# Patient Record
Sex: Male | Born: 1990 | Race: Black or African American | Hispanic: No | Marital: Single | Smoking: Never smoker
Health system: Southern US, Community
[De-identification: ages and names within clinical notes are randomized; demographics above are authoritative.]

## PROBLEM LIST (undated history)

## (undated) DIAGNOSIS — J45909 Unspecified asthma, uncomplicated: Secondary | ICD-10-CM

## (undated) DIAGNOSIS — R011 Cardiac murmur, unspecified: Secondary | ICD-10-CM

---
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
## 2012-07-22 ENCOUNTER — Emergency Department (HOSPITAL_COMMUNITY)
Admission: EM | Admit: 2012-07-22 | Discharge: 2012-07-22 | Disposition: A | Discharge status: Home / Self Care | Payer: Self-pay | Attending: Emergency Medicine | Admitting: Emergency Medicine

## 2012-07-22 ENCOUNTER — Encounter (HOSPITAL_COMMUNITY): Payer: Self-pay | Admitting: *Deleted

## 2012-07-22 DIAGNOSIS — L0291 Cutaneous abscess, unspecified: Secondary | ICD-10-CM

## 2012-07-22 DIAGNOSIS — IMO0002 Reserved for concepts with insufficient information to code with codable children: Secondary | ICD-10-CM | POA: Insufficient documentation

## 2012-07-22 MED ORDER — OXYCODONE-ACETAMINOPHEN 5-325 MG PO TABS: 2.0000 | ORAL_TABLET | ORAL | Status: AC | PRN

## 2012-07-22 MED ORDER — SULFAMETHOXAZOLE-TRIMETHOPRIM 800-160 MG PO TABS: 1.0000 | ORAL_TABLET | Freq: Two times a day (BID) | ORAL | Status: AC

## 2012-07-22 MED ORDER — LIDOCAINE HCL 2 % IJ SOLN
10.0000 mL | Freq: Once | INTRAMUSCULAR | Status: AC
  Administered 2012-07-22: 200 mg via INTRADERMAL

## 2012-07-22 NOTE — ED Notes (Signed)
Paged ortho 

## 2012-07-22 NOTE — ED Notes (Signed)
Suture cart and I&D supplies placed at room

## 2012-07-22 NOTE — Progress Notes (Signed)
Orthopedic Tech Progress Note Patient Details:  Carl [name] 07/06/1991 [id number]  Ortho Devices Type of Ortho Device: Arm foam sling Ortho Device/Splint Location: left arm Ortho Device/Splint Interventions: Application   Erik Burkett 07/22/2012, 4:04 PM

## 2012-07-22 NOTE — ED Notes (Signed)
Pt reports progressively worsening abscesses to left axillary. Denies fevers. No drainage.

## 2012-07-22 NOTE — ED Provider Notes (Signed)
History  This chart was scribed for No att. providers found by [facility]. This patient was seen in room TR05C/TR05C and the patient's care was started at 1256.   CSN: [id number]  Arrival date & time 07/22/12  1256   None     Chief Complaint  Patient presents with  . Abscess   Patient is a 21 y.o. male presenting with abscess. The history is provided by the patient. No language interpreter was used.  Abscess  Affected Location: left axilla. Pertinent negatives include no fever and no vomiting.   Carl [name] is a 21 y.o. male who presents to the Emergency Department complaining of constant gradually worsening abscesses to his left axillary. He denies any drainage to the area.   History reviewed. No pertinent past medical history.  History reviewed. No pertinent past surgical history.  History reviewed. No pertinent family history.  History  Substance Use Topics  . Smoking status: Never Smoker   . Smokeless tobacco: Not on file  . Alcohol Use: No      Review of Systems  Constitutional: Negative for fever and chills.  Respiratory: Negative for shortness of breath.   Gastrointestinal: Negative for nausea and vomiting.  Skin:       Left axillary abscesses.   Neurological: Negative for weakness.  All other systems reviewed and are negative.    Allergies  Review of patient's allergies indicates no known allergies.  Home Medications  No current outpatient prescriptions on file.  Triage Vitals: BP 146/67  Pulse 81  Temp 98.7 F (37.1 C)  Resp 16  SpO2 100%  Physical Exam  Nursing note and vitals reviewed. Constitutional: He is oriented to person, place, and time. He appears well-developed and well-nourished. No distress.  HENT:  Head: Normocephalic and atraumatic.  Eyes: EOM are normal.  Neck: Neck supple. No tracheal deviation present.  Cardiovascular: Normal rate.   Pulmonary/Chest: Effort normal. No respiratory distress.  Musculoskeletal: Normal  range of motion.  Neurological: He is alert and oriented to person, place, and time.  Skin: Skin is warm and dry.  Psychiatric: He has a normal mood and affect. His behavior is normal.    ED Course  Procedures (including critical care time) DIAGNOSTIC STUDIES: Oxygen Saturation is 100% on room air, normal by my interpretation.    COORDINATION OF CARE:   Labs Reviewed - No data to display No results found.   No diagnosis found.    MDM    I personally performed the services described in this documentation, which was scribed in my presence. The recorded information has been reviewed and considered.       Hilario Quarry, MD 07/23/12 [phone]

## 2012-07-24 ENCOUNTER — Encounter (HOSPITAL_COMMUNITY): Payer: Self-pay | Admitting: *Deleted

## 2012-07-24 ENCOUNTER — Emergency Department (HOSPITAL_COMMUNITY)
Admission: EM | Admit: 2012-07-24 | Discharge: 2012-07-24 | Disposition: A | Discharge status: Home / Self Care | Payer: Self-pay | Attending: Emergency Medicine | Admitting: Emergency Medicine

## 2012-07-24 DIAGNOSIS — IMO0002 Reserved for concepts with insufficient information to code with codable children: Secondary | ICD-10-CM | POA: Insufficient documentation

## 2012-07-24 DIAGNOSIS — L02419 Cutaneous abscess of limb, unspecified: Secondary | ICD-10-CM

## 2012-07-24 DIAGNOSIS — Z4801 Encounter for change or removal of surgical wound dressing: Secondary | ICD-10-CM | POA: Insufficient documentation

## 2012-07-24 DIAGNOSIS — Z5189 Encounter for other specified aftercare: Secondary | ICD-10-CM

## 2012-07-24 MED ORDER — TRAMADOL HCL 50 MG PO TABS: 50.0000 mg | ORAL_TABLET | Freq: Four times a day (QID) | ORAL | Status: AC | PRN

## 2012-07-24 NOTE — ED Notes (Signed)
Pt reports he needs to have packing removed from (L) axilla. Packing and dressing applied on Tuesday, pt reports he was instructed to come back on Thursday to have them removed.

## 2012-07-24 NOTE — ED Notes (Signed)
Patient is here to have packing removed from the left axilla,  The dressing/packing was applied 2 days ago

## 2012-07-24 NOTE — ED Provider Notes (Addendum)
History  This chart was scribed for Carl Skene, MD by Bennett Scrape. This patient was seen in room TR09C/TR09C and the patient's care was started at 3:30PM.  CSN: [id number]  Arrival date & time 07/24/12  1302   None     Chief Complaint  Patient presents with  . Wound Check     The history is provided by the patient. No language interpreter was used.    Carl [name] is a 21 y.o. male who presents to the Emergency Department for removal of packing from the left axilla that was applied to two separate I&D boils 2 days ago. He reports that one of the packing fell out when he removed the band-aid in the ED. He rates his pain a 13 out of 10 when he moves his arm. He states that he is currently taking Septra and was taking Percocet but ran out. He denies fevers, chills, CP, SOB and myalgias as associated symptoms. He does not have a h/o chronic medical conditions. He denies smoking and alcohol use.  History reviewed. No pertinent past medical history.  History reviewed. No pertinent past surgical history.  No family history on file.  History  Substance Use Topics  . Smoking status: Never Smoker   . Smokeless tobacco: Not on file  . Alcohol Use: No      Review of Systems  REVIEW OF SYSTEMS:   1.) CONSTITUTIONAL: No fever, chills or systemic signs of infection. No recent, unexplained weight changes.   2.) HEENT: No facial pain, sinus congestion or rhinorrhea is reported. Patient is denying any acute visual or hearing deficits. No sore throat or difficulty swallowing.  3.) NECK: No swelling or masses are reported.   4.) PULMONARY: No cough sputum production or shortness of breath was reported.   5.) CARDIAC: No palpitations, chest pain or pressure.   6.) ABDOMINAL: Denies abdominal pain, nausea, vomiting or diarrhea. No Hematochezia or melena.  7.) GENITOURINARY: No burning with urination or frequency. No discharge.  8.) BACK: Denying any flank or CVA  tenderness. No specific thoracic or lumbar pain.   9.) EXTREMITIES: Denying any extremity edema pitting or rash.   10.) NEUROLOGIC: Denying any focal or lateralizing neurologic impairments.     11.) SKIN: Two I&D areas on the left axilla. No rashes, itching   Allergies  Review of patient's allergies indicates no known allergies.  Home Medications   Current Outpatient Rx  Name Route Sig Dispense Refill  . OXYCODONE-ACETAMINOPHEN 5-325 MG PO TABS Oral Take 2 tablets by mouth every 4 (four) hours as needed for pain. 10 tablet 0  . SULFAMETHOXAZOLE-TRIMETHOPRIM 800-160 MG PO TABS Oral Take 1 tablet by mouth every 12 (twelve) hours. 10 tablet 0    Triage Vitals: BP 121/70  Pulse 63  Temp 98.3 F (36.8 C) (Oral)  Resp 16  SpO2 99%  Physical Exam   Nursing notes reviewed.  Electronic medical record reviewed. VITAL SIGNS:   Filed Vitals:   07/24/12 1312  BP: 121/70  Pulse: 63  Temp: 98.3 F (36.8 C)  TempSrc: Oral  Resp: 16  SpO2: 99%   CONSTITUTIONAL: Awake, oriented, appears non-toxic HENT: Atraumatic, normocephalic, oral mucosa pink and moist, airway patent. Nares patent without drainage. External ears normal. EYES: Conjunctiva clear, EOMI, PERRLA NECK: Trachea midline, non-tender, supple CARDIOVASCULAR: Normal heart rate, Normal rhythm, No murmurs, rubs, gallops PULMONARY/CHEST: Clear to auscultation, no rhonchi, wheezes, or rales. Symmetrical breath sounds. Non-tender. ABDOMINAL: Non-distended, soft, non-tender - no rebound or guarding.  BS normal. NEUROLOGIC: Non-focal, moving all four extremities, no gross sensory or motor deficits. EXTREMITIES: No clubbing, cyanosis, or edema.  2 abscess sites in left axilla.  One had packing fall out and is healing well.  The other still has a fair amount of induration - packing removed with some pus on packing material. SKIN: Warm, Dry, No erythema, No rash  ED Course  Wound re-exploration Performed by: Carl [name] Authorized by: Carl [name] Consent: Verbal consent obtained. Consent given by: patient Patient identity confirmed: verbally with patient Type: abscess Body area: upper extremity Local anesthetic: lidocaine 2% with epinephrine Anesthetic total: 2 ml Patient sedated: no Incision type: Used prior incision, explored with mosquito clamp. Drainage: serosanguinous Drainage amount: scant Patient tolerance: Patient tolerated the procedure well with no immediate complications.   (including critical care time)  DIAGNOSTIC STUDIES: Oxygen Saturation is 99% on room air, normal by my interpretation.    COORDINATION OF CARE: 3:55PM-Discussed treatment plan which includes cleaning out the I&D areas with pt at bedside and pt agreed to plan.  3:57PM-Flushed the area with 2 cc of 2% xylocaine with epi and explored the area.  4:00PM-Discussed discharge plan with pt at bedside and pt agreed to plan.   Labs Reviewed - No data to display No results found.   No diagnosis found.    MDM  Carl [name] is a 21 y.o. male presenting for a wound recheck of axillary skin abscess.  Packing removed from larger of two abscesses - flushed with lidocaine with epinephrine and explored for loculations as abscess site seemed painful still not consistent with healing.  No further material was expressed.  Area covered with bandages.  Packing not indicated.  Antibiotics not indicated, no surrounding cellulitis.  I explained the diagnosis and have given explicit precautions to return to the ER including worsening pain, draining pus, fever, vomiting or any other new or worsening symptoms. The patient understands and accepts the medical plan as it's been dictated and I have answered their questions. Discharge instructions concerning home care and prescriptions have been given.  The patient is STABLE and is discharged to home in good condition.    I personally performed the services described in this  documentation, which was scribed in my presence. The recorded information has been reviewed and considered. Carl [name], M.D.      Carl Skene, MD 07/28/12 1334  Carl Skene, MD 07/28/12 1336

## 2014-01-15 ENCOUNTER — Encounter (HOSPITAL_COMMUNITY): Payer: Self-pay | Admitting: Emergency Medicine

## 2014-01-15 ENCOUNTER — Emergency Department (INDEPENDENT_AMBULATORY_CARE_PROVIDER_SITE_OTHER)
Admission: EM | Admit: 2014-01-15 | Discharge: 2014-01-15 | Disposition: A | Discharge status: Home / Self Care | Payer: BC Managed Care – PPO | Source: Home / Self Care

## 2014-01-15 DIAGNOSIS — S43409A Unspecified sprain of unspecified shoulder joint, initial encounter: Secondary | ICD-10-CM

## 2014-01-15 DIAGNOSIS — IMO0002 Reserved for concepts with insufficient information to code with codable children: Secondary | ICD-10-CM

## 2014-01-15 DIAGNOSIS — M25519 Pain in unspecified shoulder: Secondary | ICD-10-CM

## 2014-01-15 DIAGNOSIS — M25512 Pain in left shoulder: Secondary | ICD-10-CM

## 2014-01-15 MED ORDER — NAPROXEN 500 MG PO TABS: 500.0000 mg | ORAL_TABLET | Freq: Two times a day (BID) | ORAL | Status: DC

## 2014-01-15 NOTE — ED Notes (Signed)
Patient complains of left shoulder pain May have injured it playing basketball

## 2014-01-15 NOTE — ED Provider Notes (Signed)
Medical screening examination/treatment/procedure(s) were performed by non-physician practitioner and as supervising physician I was immediately available for consultation/collaboration.  Leslee Homeavid Tinie Mcgloin, M.D.  Reuben Likesavid C Sarahelizabeth Conway, MD 01/15/14 2224

## 2014-01-15 NOTE — ED Provider Notes (Signed)
CSN: [id number]     Arrival date & time 01/15/14  1745 History   None    Chief Complaint  Patient presents with  . Shoulder Pain   (Consider location/radiation/quality/duration/timing/severity/associated sxs/prior Treatment) HPI Comments: Patient presents with a 2 day history of left shoulder pain. 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 He felt a "twinge" while lifting 65lb box at [employer] a week ago, but it felt fine until a few days ago. Pain is anterior and with ROM. Denies neck pain. Denies numbness or tingling. Denies weakness. No previous injury to left shoulder.  Patient is a 23 y.o. male presenting with shoulder pain. The history is provided by the patient.  Shoulder Pain    History reviewed. No pertinent past medical history. History reviewed. No pertinent past surgical history. No family history on file. History  Substance Use Topics  . Smoking status: Never Smoker   . Smokeless tobacco: Not on file  . Alcohol Use: No    Review of Systems  All other systems reviewed and are negative.    Allergies  Review of patient's allergies indicates no known allergies.  Home Medications   Current Outpatient Rx  Name  Route  Sig  Dispense  Refill  . naproxen (NAPROSYN) 500 MG tablet   Oral   Take 1 tablet (500 mg total) by mouth 2 (two) times daily.   30 tablet   0    BP 118/76  Pulse 68  Temp(Src) 97.7 F (36.5 C) (Oral)  Resp 16  SpO2 99% Physical Exam  Nursing note and vitals reviewed. Constitutional: He is oriented to person, place, and time. He appears well-developed and well-nourished. No distress.  HENT:  Head: Normocephalic and atraumatic.  Neck: Normal range of motion.  Pulmonary/Chest: Effort normal.  Musculoskeletal: He exhibits no edema and no tenderness.  Left shoulder without swelling. No effusions or deformities. Full painful ROM in all planes, but limited internal rotation. Good strength. Negative impingement sign   Neurological: He is alert and oriented to person, place, and time. A cranial nerve deficit is present. He exhibits normal muscle tone.  Skin: Skin is warm and dry. He is not diaphoretic.  Psychiatric: His behavior is normal.    ED Course  Procedures (including critical care time) Labs Review Labs Reviewed - No data to display Imaging Review No results found.   MDM   1. Shoulder sprain   2. Shoulder pain, left    Probable tendonitis/strain. Good strength; doubtful tear. Instruct to ice, rest, gentle ROM exercises without heavy lifting, and NSAID's.  He requested sling, and this is ok for comfort some of the time, but to avoid adhesive capsulitis rec coming out multiple times a day for gentle ROM. May f/u with Dr. Lajoyce Cornersuda if not improved.     Riki SheerMichelle G Aquinnah Devin, PA-C 01/15/14 1929

## 2014-01-15 NOTE — Discharge Instructions (Signed)
Muscle Strain A muscle strain (pulled muscle) happens when a muscle is stretched beyond normal length. It happens when a sudden, violent force stretches your muscle too far. Usually, a few of the fibers in your muscle are torn. Muscle strain is common in athletes. Recovery usually takes 1 2 weeks. Complete healing takes 5 6 weeks.  HOME CARE   Follow the PRICE method of treatment to help your injury get better. Do this the first 2 3 days after the injury:  Protect. Protect the muscle to keep it from getting injured again.  Rest. Limit your activity and rest the injured body part.  Ice. Put ice in a plastic bag. Place a towel between your skin and the bag. Then, apply the ice and leave it on from 15 20 minutes each hour. After the third day, switch to moist heat packs.  Compression. Use a splint or elastic bandage on the injured area for comfort. Do not put it on too tightly.  Elevate. Keep the injured body part above the level of your heart.  Only take medicine as told by your doctor.  Warm up before doing exercise to prevent future muscle strains. GET HELP IF:   You have more pain or puffiness (swelling) in the injured area.  You feel numbness, tingling, or notice a loss of strength in the injured area. MAKE SURE YOU:   Understand these instructions.  Will watch your condition.  Will get help right away if you are not doing well or get worse. Document Released: 08/07/2008 Document Revised: 08/19/2013 Document Reviewed: 05/28/2013 [facility] Patient Information 2014 [place], [place].   Ok to use sling for comfort, but come out of it for ROM. Apply ice to area 4x day for 20 min. Take the Naprosyn 2x a day for the next week. F/U with Dr. Lajoyce Cornersuda

## 2014-07-23 ENCOUNTER — Emergency Department (HOSPITAL_COMMUNITY)
Admission: EM | Admit: 2014-07-23 | Discharge: 2014-07-23 | Disposition: A | Discharge status: Home / Self Care | Payer: BC Managed Care – PPO | Attending: Emergency Medicine | Admitting: Emergency Medicine

## 2014-07-23 ENCOUNTER — Encounter (HOSPITAL_COMMUNITY): Payer: Self-pay | Admitting: Emergency Medicine

## 2014-07-23 DIAGNOSIS — R011 Cardiac murmur, unspecified: Secondary | ICD-10-CM | POA: Diagnosis not present

## 2014-07-23 DIAGNOSIS — Y92009 Unspecified place in unspecified non-institutional (private) residence as the place of occurrence of the external cause: Secondary | ICD-10-CM | POA: Insufficient documentation

## 2014-07-23 DIAGNOSIS — T5891XA Toxic effect of carbon monoxide from unspecified source, accidental (unintentional), initial encounter: Secondary | ICD-10-CM | POA: Diagnosis not present

## 2014-07-23 DIAGNOSIS — Z7729 Contact with and (suspected ) exposure to other hazardous substances: Secondary | ICD-10-CM

## 2014-07-23 DIAGNOSIS — Z791 Long term (current) use of non-steroidal anti-inflammatories (NSAID): Secondary | ICD-10-CM | POA: Insufficient documentation

## 2014-07-23 DIAGNOSIS — T5894XA Toxic effect of carbon monoxide from unspecified source, undetermined, initial encounter: Secondary | ICD-10-CM | POA: Insufficient documentation

## 2014-07-23 DIAGNOSIS — J45909 Unspecified asthma, uncomplicated: Secondary | ICD-10-CM | POA: Insufficient documentation

## 2014-07-23 DIAGNOSIS — Y9389 Activity, other specified: Secondary | ICD-10-CM | POA: Insufficient documentation

## 2014-07-23 HISTORY — DX: Cardiac murmur, unspecified: R01.1

## 2014-07-23 HISTORY — DX: Unspecified asthma, uncomplicated: J45.909

## 2014-07-23 NOTE — Discharge Instructions (Signed)
You have been medically cleared by the emergency department.  °Signs and symptoms of carbon monoxide poisoning may include:  °Dull headache  °Weakness  °Dizziness  °Nausea or vomiting  °Shortness of breath  °Confusion  °Blurred vision  °Loss of consciousness  °Carbon monoxide poisoning can be especially dangerous for people who are sleeping or intoxicated. People may have irreversible brain damage or even be killed before anyone realizes there's a problem.  °Please return if these symptoms worsen.  ° °
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
## 2014-07-23 NOTE — ED Provider Notes (Signed)
Medical screening examination/treatment/procedure(s) were performed by non-physician practitioner and as supervising physician I was immediately available for consultation/collaboration.   EKG Interpretation None        Zanovia Rotz, MD 07/23/14 1605 

## 2014-10-04 ENCOUNTER — Emergency Department (HOSPITAL_COMMUNITY)
Admission: EM | Admit: 2014-10-04 | Discharge: 2014-10-04 | Disposition: A | Discharge status: Home / Self Care | Payer: BC Managed Care – PPO | Attending: Emergency Medicine | Admitting: Emergency Medicine

## 2014-10-04 ENCOUNTER — Encounter (HOSPITAL_COMMUNITY): Payer: Self-pay | Admitting: *Deleted

## 2014-10-04 DIAGNOSIS — S29012A Strain of muscle and tendon of back wall of thorax, initial encounter: Secondary | ICD-10-CM | POA: Diagnosis not present

## 2014-10-04 DIAGNOSIS — S3992XA Unspecified injury of lower back, initial encounter: Secondary | ICD-10-CM | POA: Diagnosis present

## 2014-10-04 DIAGNOSIS — Y92410 Unspecified street and highway as the place of occurrence of the external cause: Secondary | ICD-10-CM | POA: Diagnosis not present

## 2014-10-04 DIAGNOSIS — Y9389 Activity, other specified: Secondary | ICD-10-CM | POA: Insufficient documentation

## 2014-10-04 DIAGNOSIS — R011 Cardiac murmur, unspecified: Secondary | ICD-10-CM | POA: Insufficient documentation

## 2014-10-04 DIAGNOSIS — Z791 Long term (current) use of non-steroidal anti-inflammatories (NSAID): Secondary | ICD-10-CM | POA: Insufficient documentation

## 2014-10-04 DIAGNOSIS — Y998 Other external cause status: Secondary | ICD-10-CM | POA: Diagnosis not present

## 2014-10-04 DIAGNOSIS — J45909 Unspecified asthma, uncomplicated: Secondary | ICD-10-CM | POA: Insufficient documentation

## 2014-10-04 DIAGNOSIS — Z79899 Other long term (current) drug therapy: Secondary | ICD-10-CM | POA: Diagnosis not present

## 2014-10-04 DIAGNOSIS — S39012A Strain of muscle, fascia and tendon of lower back, initial encounter: Secondary | ICD-10-CM | POA: Diagnosis not present

## 2014-10-04 DIAGNOSIS — M549 Dorsalgia, unspecified: Secondary | ICD-10-CM

## 2014-10-04 DIAGNOSIS — T148XXA Other injury of unspecified body region, initial encounter: Secondary | ICD-10-CM

## 2014-10-04 MED ORDER — METHOCARBAMOL 500 MG PO TABS: 500.0000 mg | ORAL_TABLET | Freq: Two times a day (BID) | ORAL | Status: DC

## 2014-10-04 MED ORDER — HYDROCODONE-ACETAMINOPHEN 5-325 MG PO TABS
2.0000 | ORAL_TABLET | Freq: Once | ORAL | Status: AC
  Administered 2014-10-04: 2 via ORAL
  Filled 2014-10-04: qty 2

## 2014-10-04 MED ORDER — METHOCARBAMOL 500 MG PO TABS
500.0000 mg | ORAL_TABLET | Freq: Once | ORAL | Status: AC
  Administered 2014-10-04: 500 mg via ORAL
  Filled 2014-10-04: qty 1

## 2014-10-04 MED ORDER — PREDNISONE 20 MG PO TABS: 40.0000 mg | ORAL_TABLET | Freq: Every day | ORAL | Status: DC

## 2014-10-04 MED ORDER — TRAMADOL HCL 50 MG PO TABS
50.0000 mg | ORAL_TABLET | Freq: Four times a day (QID) | ORAL | Status: DC | PRN
Start: 2014-10-04 — End: 2014-12-24
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
## 2014-12-24 ENCOUNTER — Encounter (HOSPITAL_COMMUNITY): Payer: Self-pay | Admitting: Emergency Medicine

## 2014-12-24 ENCOUNTER — Emergency Department (HOSPITAL_COMMUNITY)
Admission: EM | Admit: 2014-12-24 | Discharge: 2014-12-24 | Disposition: A | Discharge status: Home / Self Care | Payer: BLUE CROSS/BLUE SHIELD | Attending: Emergency Medicine | Admitting: Emergency Medicine

## 2014-12-24 ENCOUNTER — Emergency Department (HOSPITAL_COMMUNITY): Payer: BLUE CROSS/BLUE SHIELD

## 2014-12-24 ENCOUNTER — Emergency Department (HOSPITAL_COMMUNITY)
Admission: EM | Admit: 2014-12-24 | Discharge: 2014-12-24 | Disposition: A | Discharge status: Nursing Home (Includes ICF) | Payer: PRIVATE HEALTH INSURANCE | Source: Home / Self Care | Attending: Emergency Medicine | Admitting: Emergency Medicine

## 2014-12-24 DIAGNOSIS — R0789 Other chest pain: Secondary | ICD-10-CM

## 2014-12-24 DIAGNOSIS — J45909 Unspecified asthma, uncomplicated: Secondary | ICD-10-CM | POA: Insufficient documentation

## 2014-12-24 DIAGNOSIS — R079 Chest pain, unspecified: Secondary | ICD-10-CM

## 2014-12-24 DIAGNOSIS — Z79899 Other long term (current) drug therapy: Secondary | ICD-10-CM | POA: Insufficient documentation

## 2014-12-24 DIAGNOSIS — R011 Cardiac murmur, unspecified: Secondary | ICD-10-CM | POA: Insufficient documentation

## 2014-12-24 LAB — CBC WITH DIFFERENTIAL/PLATELET
BASOS ABS: 0 10*3/uL (ref 0.0–0.1)
BASOS PCT: 0 % (ref 0–1)
Eosinophils Absolute: 0.2 10*3/uL (ref 0.0–0.7)
Eosinophils Relative: 3 % (ref 0–5)
HCT: 36.3 % — ABNORMAL LOW (ref 39.0–52.0)
Hemoglobin: 12.3 g/dL — ABNORMAL LOW (ref 13.0–17.0)
LYMPHS PCT: 39 % (ref 12–46)
Lymphs Abs: 1.7 10*3/uL (ref 0.7–4.0)
MCH: 28.4 pg (ref 26.0–34.0)
MCHC: 33.9 g/dL (ref 30.0–36.0)
MCV: 83.8 fL (ref 78.0–100.0)
Monocytes Absolute: 0.3 10*3/uL (ref 0.1–1.0)
Monocytes Relative: 7 % (ref 3–12)
NEUTROS ABS: 2.3 10*3/uL (ref 1.7–7.7)
NEUTROS PCT: 51 % (ref 43–77)
Platelets: 198 10*3/uL (ref 150–400)
RBC: 4.33 MIL/uL (ref 4.22–5.81)
RDW: 12.7 % (ref 11.5–15.5)
WBC: 4.5 10*3/uL (ref 4.0–10.5)

## 2014-12-24 LAB — I-STAT TROPONIN, ED: TROPONIN I, POC: 0 ng/mL (ref 0.00–0.08)

## 2014-12-24 LAB — BASIC METABOLIC PANEL
Anion gap: 4 — ABNORMAL LOW (ref 5–15)
BUN: 8 mg/dL (ref 6–23)
CALCIUM: 8.9 mg/dL (ref 8.4–10.5)
CO2: 32 mmol/L (ref 19–32)
Chloride: 103 mmol/L (ref 96–112)
Creatinine, Ser: 0.88 mg/dL (ref 0.50–1.35)
GLUCOSE: 89 mg/dL (ref 70–99)
POTASSIUM: 3.6 mmol/L (ref 3.5–5.1)
Sodium: 139 mmol/L (ref 135–145)

## 2014-12-24 MED ORDER — OMEPRAZOLE 20 MG PO CPDR: 20.0000 mg | DELAYED_RELEASE_CAPSULE | Freq: Every day | ORAL | Status: DC

## 2014-12-24 MED ORDER — ALBUTEROL SULFATE HFA 108 (90 BASE) MCG/ACT IN AERS
1.0000 | INHALATION_SPRAY | Freq: Four times a day (QID) | RESPIRATORY_TRACT | Status: DC | PRN
Start: 2014-12-24 — End: 2019-11-25

## 2014-12-24 NOTE — ED Notes (Signed)
Phlebotomy at the bedside  

## 2014-12-24 NOTE — ED Provider Notes (Signed)
CSN: [id number]     Arrival date & time 12/24/14  2104 History   First MD Initiated Contact with Patient 12/24/14 2113     Chief Complaint  Patient presents with  . Chest Pain     Patient is a 24 y.o. male presenting with chest pain. The history is provided by the patient. No language interpreter was used.  Chest Pain  Carl [name]  Reasons for evaluation of chest pain. He reports that he has had intermittent right-sided chest pain since December. The pain is described as sharp at times. It is located along his epigastrium to central sternum and right chest. Symptoms are waxing and waning with no clear alleviating or worsening factors. Denies any fevers, coughing, shortness of breath. He occasionally has shortness of breath with the chest pain. He denies any leg swelling or pain, abdominal pain, vomiting, diarrhea. When he has his episodes he takes Tums and this inhaler and sometimes he feels better. He was referred from urgent care for evaluation after having an abnormal EKG performed there.  Past Medical History  Diagnosis Date  . Heart murmur   . Asthma    History reviewed. No pertinent past surgical history. History reviewed. No pertinent family history. History  Substance Use Topics  . Smoking status: Never Smoker   . Smokeless tobacco: Not on file  . Alcohol Use: Yes     Comment: social    Review of Systems  Cardiovascular: Positive for chest pain.  All other systems reviewed and are negative.     Allergies  Nsaids  Home Medications   Prior to Admission medications   Medication Sig Start Date End Date Taking? Authorizing Provider  albuterol (PROVENTIL HFA;VENTOLIN HFA) 108 (90 BASE) MCG/ACT inhaler Inhale 2 puffs into the lungs every 6 (six) hours as needed for wheezing or shortness of breath.   Yes Historical Provider, MD  omeprazole (PRILOSEC) 20 MG capsule Take 1 capsule (20 mg total) by mouth daily. 12/24/14   Tilden FossaElizabeth Jonne Rote, MD   BP 118/61 mmHg  Pulse 66   Temp(Src) 98 F (36.7 C) (Oral)  Resp 14  Ht 6' (1.829 m)  Wt 210 lb (95.255 kg)  BMI 28.47 kg/m2  SpO2 98% Physical Exam  Constitutional: He is oriented to person, place, and time. He appears well-developed and well-nourished.  HENT:  Head: Normocephalic and atraumatic.  Cardiovascular: Normal rate and regular rhythm.   No murmur heard. Pulmonary/Chest: Effort normal and breath sounds normal. No respiratory distress.  Abdominal: Soft. There is no tenderness. There is no rebound and no guarding.  Musculoskeletal: He exhibits no edema or tenderness.  Neurological: He is alert and oriented to person, place, and time.  Skin: Skin is warm and dry.  Psychiatric: He has a normal mood and affect. His behavior is normal.  Nursing note and vitals reviewed.   ED Course  Procedures (including critical care time) Labs Review Labs Reviewed  CBC WITH DIFFERENTIAL/PLATELET - Abnormal; Notable for the following:    Hemoglobin 12.3 (*)    HCT 36.3 (*)    All other components within normal limits  BASIC METABOLIC PANEL - Abnormal; Notable for the following:    Anion gap 4 (*)    All other components within normal limits  I-STAT TROPOININ, ED    Imaging Review Dg Chest 2 View  12/24/2014   CLINICAL DATA:  Midsternal chest pain. Shortness of breath. Abnormal EKG.  EXAM: CHEST  2 VIEW  COMPARISON:  None.  FINDINGS: The heart size  and mediastinal contours are within normal limits. Both lungs are clear. The visualized skeletal structures are unremarkable.  IMPRESSION: No active cardiopulmonary disease.   Electronically Signed   By: Myles Rosenthal M.D.   On: 12/24/2014 21:48     EKG Interpretation   Date/Time:  Friday December 24 2014 21:11:03 EST Ventricular Rate:  61 PR Interval:  188 QRS Duration: 82 QT Interval:  403 QTC Calculation: 406 R Axis:   86 Text Interpretation:  Sinus rhythm Probable anteroseptal infarct, old ST  elevation - c/w early repolarization Confirmed by Lincoln Brigham  [phone]) on  12/24/2014 9:50:17 PM      MDM   Final diagnoses:  Atypical chest pain     Patient with history of heart murmur here for evaluation of chest pain from urgent care given his abnormal EKG there. Reviewed EKG from urgent care - there was some artifact present and some ST elevation consistent with early repolarization. Clinical picture is not consistent with STEMI, pericarditis, dissection. Patient is perk negative, doubt PE. Chest pain appears to be more GI related. Starting on omeprazole. Do recommend the patient follows up with cardiology 4 further evaluation given that he has been followed by cardiology in the past for heart murmur with echo is performed. He denies a history of HOCM.    Tilden Fossa, MD 12/25/14 [phone]

## 2014-12-24 NOTE — Discharge Instructions (Signed)
Chest Pain (Nonspecific) °It is often hard to give a specific diagnosis for the cause of chest pain. There is always a chance that your pain could be related to something serious, such as a heart attack or a blood clot in the lungs. You need to follow up with your health care provider for further evaluation. °CAUSES  °· Heartburn. °· Pneumonia or bronchitis. °· Anxiety or stress. °· Inflammation around your heart (pericarditis) or lung (pleuritis or pleurisy). °· A blood clot in the lung. °· A collapsed lung (pneumothorax). It can develop suddenly on its own (spontaneous pneumothorax) or from trauma to the chest. °· Shingles infection (herpes zoster virus). °The chest wall is composed of bones, muscles, and cartilage. Any of these can be the source of the pain. °· The bones can be bruised by injury. °· The muscles or cartilage can be strained by coughing or overwork. °· The cartilage can be affected by inflammation and become sore (costochondritis). °DIAGNOSIS  °Lab tests or other studies may be needed to find the cause of your pain. Your health care provider may have you take a test called an ambulatory electrocardiogram (ECG). An ECG records your heartbeat patterns over a 24-hour period. You may also have other tests, such as: °· Transthoracic echocardiogram (TTE). During echocardiography, sound waves are used to evaluate how blood flows through your heart. °· Transesophageal echocardiogram (TEE). °· Cardiac monitoring. This allows your health care provider to monitor your heart rate and rhythm in real time. °· Holter monitor. This is a portable device that records your heartbeat and can help diagnose heart arrhythmias. It allows your health care provider to track your heart activity for several days, if needed. °· Stress tests by exercise or by giving medicine that makes the heart beat faster. °TREATMENT  °· Treatment depends on what may be causing your chest pain. Treatment may include: °· Acid blockers for  heartburn. °· Anti-inflammatory medicine. °· Pain medicine for inflammatory conditions. °· Antibiotics if an infection is present. °· You may be advised to change lifestyle habits. This includes stopping smoking and avoiding alcohol, caffeine, and chocolate. °· You may be advised to keep your head raised (elevated) when sleeping. This reduces the chance of acid going backward from your stomach into your esophagus. °Most of the time, nonspecific chest pain will improve within 2-3 days with rest and mild pain medicine.  °HOME CARE INSTRUCTIONS  °· If antibiotics were prescribed, take them as directed. Finish them even if you start to feel better. °· For the next few days, avoid physical activities that bring on chest pain. Continue physical activities as directed. °· Do not use any tobacco products, including cigarettes, chewing tobacco, or electronic cigarettes. °· Avoid drinking alcohol. °· Only take medicine as directed by your health care provider. °· Follow your health care provider's suggestions for further testing if your chest pain does not go away. °· Keep any follow-up appointments you made. If you do not go to an appointment, you could develop lasting (chronic) problems with pain. If there is any problem keeping an appointment, call to reschedule. °SEEK MEDICAL CARE IF:  °· Your chest pain does not go away, even after treatment. °· You have a rash with blisters on your chest. °· You have a fever. °SEEK IMMEDIATE MEDICAL CARE IF:  °· You have increased chest pain or pain that spreads to your arm, neck, jaw, back, or abdomen. °· You have shortness of breath. °· You have an increasing cough, or you cough   up blood. °· You have severe back or abdominal pain. °· You feel nauseous or vomit. °· You have severe weakness. °· You faint. °· You have chills. °This is an emergency. Do not wait to see if the pain will go away. Get medical help at once. Call your local emergency services (911 in [place] Do not drive  yourself to the hospital. °MAKE SURE YOU:  °· Understand these instructions. °· Will watch your condition. °· Will get help right away if you are not doing well or get worse. °Document Released: 08/08/2005 Document Revised: 11/03/2013 Document Reviewed: 06/03/2008 °ExitCare® Patient Information ©2015 [employer]. This information is not intended to replace advice given to you by your health care provider. Make sure you discuss any questions you have with your health care provider. °Gastroesophageal Reflux Disease, Adult °Gastroesophageal reflux disease (GERD) happens when acid from your stomach flows up into the esophagus. When acid comes in contact with the esophagus, the acid causes soreness (inflammation) in the esophagus. Over time, GERD may create small holes (ulcers) in the lining of the esophagus. °CAUSES  °· Increased body weight. This puts pressure on the stomach, making acid rise from the stomach into the esophagus. °· Smoking. This increases acid production in the stomach. °· Drinking alcohol. This causes decreased pressure in the lower esophageal sphincter (valve or ring of muscle between the esophagus and stomach), allowing acid from the stomach into the esophagus. °· Late evening meals and a full stomach. This increases pressure and acid production in the stomach. °· A malformed lower esophageal sphincter. °Sometimes, no cause is found. °SYMPTOMS  °· Burning pain in the lower part of the mid-chest behind the breastbone and in the mid-stomach area. This may occur twice a week or more often. °· Trouble swallowing. °· Sore throat. °· Dry cough. °· Asthma-like symptoms including chest tightness, shortness of breath, or wheezing. °DIAGNOSIS  °Your caregiver may be able to diagnose GERD based on your symptoms. In some cases, X-rays and other tests may be done to check for complications or to check the condition of your stomach and esophagus. °TREATMENT  °Your caregiver may recommend over-the-counter or  prescription medicines to help decrease acid production. Ask your caregiver before starting or adding any new medicines.  °HOME CARE INSTRUCTIONS  °· Change the factors that you can control. Ask your caregiver for guidance concerning weight loss, quitting smoking, and alcohol consumption. °· Avoid foods and drinks that make your symptoms worse, such as: °¨ Caffeine or alcoholic drinks. °¨ Chocolate. °¨ Peppermint or mint flavorings. °¨ Garlic and onions. °¨ Spicy foods. °¨ Citrus fruits, such as oranges, lemons, or limes. °¨ Tomato-based foods such as sauce, chili, salsa, and pizza. °¨ Fried and fatty foods. °· Avoid lying down for the 3 hours prior to your bedtime or prior to taking a nap. °· Eat small, frequent meals instead of large meals. °· Wear loose-fitting clothing. Do not wear anything tight around your waist that causes pressure on your stomach. °· Raise the head of your bed 6 to 8 inches with wood blocks to help you sleep. Extra pillows will not help. °· Only take over-the-counter or prescription medicines for pain, discomfort, or fever as directed by your caregiver. °· Do not take aspirin, ibuprofen, or other nonsteroidal anti-inflammatory drugs (NSAIDs). °SEEK IMMEDIATE MEDICAL CARE IF:  °· You have pain in your arms, neck, jaw, teeth, or back. °· Your pain increases or changes in intensity or duration. °· You develop nausea, vomiting, or sweating (diaphoresis). °·   You develop shortness of breath, or you faint. °· Your vomit is green, yellow, black, or looks like coffee grounds or blood. °· Your stool is red, bloody, or black. °These symptoms could be signs of other problems, such as heart disease, gastric bleeding, or esophageal bleeding. °MAKE SURE YOU:  °· Understand these instructions. °· Will watch your condition. °· Will get help right away if you are not doing well or get worse. °Document Released: 08/08/2005 Document Revised: 01/21/2012 Document Reviewed: 05/18/2011 °ExitCare® Patient  Information ©2015 [employer]. This information is not intended to replace advice given to you by your health care provider. Make sure you discuss any questions you have with your health care provider. ° °

## 2014-12-24 NOTE — ED Notes (Signed)
Pt states that he has had some chest discomfort since December 2015. Pt also complains of back pain that he has had for a few months.

## 2014-12-24 NOTE — ED Notes (Signed)
Pt will be transferred to the [facility] via [facility]. Report given to charge nurse Janett BillowJennaya RN.

## 2014-12-24 NOTE — ED Notes (Signed)
Per [facility]: Pt [facility] from [facility] where he was seen for Chest Pain.  Pt had irregular EKG (at bedside for review).  Pt has a childhood history of heart murmur which he "had to take antibiotics when I went to the dentist".  Denies any CP at this time.

## 2014-12-24 NOTE — ED Provider Notes (Signed)
CSN: [id number]     Arrival date & time 12/24/14  1925 History   First MD Initiated Contact with Patient 12/24/14 1943     Chief Complaint  Patient presents with  . Back Pain  . Chest Pain   (Consider location/radiation/quality/duration/timing/severity/associated sxs/prior Treatment) HPI Comments: Carl [name] is a very pleasant 24 yo black male with a history of asthma; who presents with right sided chest pain. Onset December 2015 that "comes and goes". Mainly at night when lying flat, at times while eating he can feel "pressure" on the right side. Milk sometimes can ease the pain. He feels like he is having a "heart attack" at times and is very nervous about this. He denies radicular pain. No SOB or cough. Uses a rescue inhaler when needed. He is not hurting today.  He also suffers with pain along the low back with ROM and lifting at work; he denies radicular pain, numbness or weakness.   The history is provided by the patient.    Past Medical History  Diagnosis Date  . Heart murmur   . Asthma    History reviewed. No pertinent past surgical history. History reviewed. No pertinent family history. History  Substance Use Topics  . Smoking status: Never Smoker   . Smokeless tobacco: Not on file  . Alcohol Use: Yes     Comment: social    Review of Systems  Constitutional: Negative for fever, activity change and fatigue.  Respiratory: Negative for apnea, cough, choking, chest tightness and wheezing.   Cardiovascular: Positive for chest pain.  Gastrointestinal: Negative.  Negative for nausea, vomiting and abdominal pain.  Musculoskeletal: Positive for back pain. Negative for myalgias and joint swelling.  Skin: Negative.   Allergic/Immunologic: Positive for environmental allergies. Negative for food allergies.  Neurological: Negative for light-headedness.  Psychiatric/Behavioral: Negative.     Allergies  Nsaids  Home Medications   Prior to Admission medications   Medication Sig  Start Date End Date Taking? Authorizing Provider  methocarbamol (ROBAXIN) 500 MG tablet Take 1 tablet (500 mg total) by mouth 2 (two) times daily. 10/04/14   Antony Madura, PA-C  naproxen (NAPROSYN) 500 MG tablet Take 1 tablet (500 mg total) by mouth 2 (two) times daily. Patient not taking: Reported on 10/04/2014 01/15/14   Riki Sheer, PA-C  predniSONE (DELTASONE) 20 MG tablet Take 2 tablets (40 mg total) by mouth daily. 10/04/14   Antony Madura, PA-C  traMADol (ULTRAM) 50 MG tablet Take 1 tablet (50 mg total) by mouth every 6 (six) hours as needed. 10/04/14   Antony Madura, PA-C   BP 112/58 mmHg  Pulse 65  Temp(Src) 98 F (36.7 C) (Oral)  Resp 16  SpO2 98% Physical Exam  Constitutional: He is oriented to person, place, and time. He appears well-developed and well-nourished. No distress.  HENT:  Head: Normocephalic and atraumatic.  Cardiovascular: Normal rate and regular rhythm.   Murmur heard. Pulmonary/Chest: Effort normal and breath sounds normal. No respiratory distress. He has no wheezes. He exhibits no tenderness.  Abdominal: Soft. He exhibits no distension. There is no tenderness. There is no rebound and no guarding.  Musculoskeletal: Normal range of motion.  Normal ROM in the lumbar spine without reproduction of pain  Neurological: He is alert and oriented to person, place, and time.  Skin: Skin is warm and dry. He is not diaphoretic.  Psychiatric: His behavior is normal.  Nursing note and vitals reviewed.   ED Course  ED EKG  Date/Time: 12/24/2014 8:20 PM  Performed by: Riki SheerYOUNG, Daniel Ritthaler G Authorized by: Riki SheerYOUNG, Ottis Sarnowski G Interpreted by ED physician Comparison: not compared with previous ECG  Previous ECG: no previous ECG available Rhythm: sinus rhythm Clinical impression: abnormal ECG Comments: Dr. Lorenz CoasterKeller over read=possible septal infarct   (including critical care time) Labs Review Labs Reviewed - No data to display  Imaging Review No results found.   MDM   1.  Chest pain, unspecified chest pain type   Discussed with Dr. Lorenz CoasterKeller. Given atypical presentation and Abnormal EKG will send to the ER for further cardiac work up.     Riki SheerMichelle G Jaxtin Raimondo, PA-C 12/24/14 2022

## 2015-01-11 ENCOUNTER — Encounter (HOSPITAL_COMMUNITY): Payer: Self-pay | Admitting: Emergency Medicine

## 2015-01-11 ENCOUNTER — Emergency Department (INDEPENDENT_AMBULATORY_CARE_PROVIDER_SITE_OTHER)
Admission: EM | Admit: 2015-01-11 | Discharge: 2015-01-11 | Disposition: A | Discharge status: Home / Self Care | Payer: BLUE CROSS/BLUE SHIELD | Source: Home / Self Care | Attending: Family Medicine | Admitting: Family Medicine

## 2015-01-11 DIAGNOSIS — M549 Dorsalgia, unspecified: Secondary | ICD-10-CM

## 2015-01-11 DIAGNOSIS — M62830 Muscle spasm of back: Secondary | ICD-10-CM

## 2015-01-11 DIAGNOSIS — M546 Pain in thoracic spine: Secondary | ICD-10-CM

## 2015-01-11 MED ORDER — DIAZEPAM 5 MG PO TABS: 5.0000 mg | ORAL_TABLET | Freq: Three times a day (TID) | ORAL | Status: DC | PRN
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
## 2015-01-11 NOTE — ED Notes (Signed)
C/o  Waking this a.m with pain in the upper left back shoulder blade area.  Denies injury.  Pt has not tried any pain meds.  No relief with massage.

## 2015-01-11 NOTE — Discharge Instructions (Signed)
Back Pain, Adult Low back pain is very common. About 1 in 5 people have back pain.The cause of low back pain is rarely dangerous. The pain often gets better over time.About half of people with a sudden onset of back pain feel better in just 2 weeks. About 8 in 10 people feel better by 6 weeks.  CAUSES Some common causes of back pain include:  Strain of the muscles or ligaments supporting the spine.  Wear and tear (degeneration) of the spinal discs.  Arthritis.  Direct injury to the back. DIAGNOSIS Most of the time, the direct cause of low back pain is not known.However, back pain can be treated effectively even when the exact cause of the pain is unknown.Answering your caregiver's questions about your overall health and symptoms is one of the most accurate ways to make sure the cause of your pain is not dangerous. If your caregiver needs more information, he or she may order lab work or imaging tests (X-rays or MRIs).However, even if imaging tests show changes in your back, this usually does not require surgery. HOME CARE INSTRUCTIONS For many people, back pain returns.Since low back pain is rarely dangerous, it is often a condition that people can learn to manageon their own.   Remain active. It is stressful on the back to sit or stand in one place. Do not sit, drive, or stand in one place for more than 30 minutes at a time. Take short walks on level surfaces as soon as pain allows.Try to increase the length of time you walk each day.  Do not stay in bed.Resting more than 1 or 2 days can delay your recovery.  Do not avoid exercise or work.Your body is made to move.It is not dangerous to be active, even though your back may hurt.Your back will likely heal faster if you return to being active before your pain is gone.  Pay attention to your body when you bend and lift. Many people have less discomfortwhen lifting if they bend their knees, keep the load close to their bodies,and  avoid twisting. Often, the most comfortable positions are those that put less stress on your recovering back.  Find a comfortable position to sleep. Use a firm mattress and lie on your side with your knees slightly bent. If you lie on your back, put a pillow under your knees.  Only take over-the-counter or prescription medicines as directed by your caregiver. Over-the-counter medicines to reduce pain and inflammation are often the most helpful.Your caregiver may prescribe muscle relaxant drugs.These medicines help dull your pain so you can more quickly return to your normal activities and healthy exercise.  Put ice on the injured area.  Put ice in a plastic bag.  Place a towel between your skin and the bag.  Leave the ice on for 15-20 minutes, 03-04 times a day for the first 2 to 3 days. After that, ice and heat may be alternated to reduce pain and spasms.  Ask your caregiver about trying back exercises and gentle massage. This may be of some benefit.  Avoid feeling anxious or stressed.Stress increases muscle tension and can worsen back pain.It is important to recognize when you are anxious or stressed and learn ways to manage it.Exercise is a great option. SEEK MEDICAL CARE IF:  You have pain that is not relieved with rest or medicine.  You have pain that does not improve in 1 week.  You have new symptoms.  You are generally not feeling well. SEEK   IMMEDIATE MEDICAL CARE IF:   You have pain that radiates from your back into your legs.  You develop new bowel or bladder control problems.  You have unusual weakness or numbness in your arms or legs.  You develop nausea or vomiting.  You develop abdominal pain.  You feel faint. Document Released: 10/29/2005 Document Revised: 04/29/2012 Document Reviewed: 03/02/2014 Exit[employer] Patient Information 2015 [employer]. This information is not intended to replace advice given to you by your health care provider. Make sure you  discuss any questions you have with your health care provider.  

## 2015-01-14 ENCOUNTER — Encounter (HOSPITAL_COMMUNITY): Payer: Self-pay | Admitting: Emergency Medicine

## 2015-01-14 ENCOUNTER — Emergency Department (INDEPENDENT_AMBULATORY_CARE_PROVIDER_SITE_OTHER)
Admission: EM | Admit: 2015-01-14 | Discharge: 2015-01-14 | Disposition: A | Discharge status: Home / Self Care | Payer: BLUE CROSS/BLUE SHIELD | Source: Home / Self Care | Attending: Emergency Medicine | Admitting: Emergency Medicine

## 2015-01-14 DIAGNOSIS — S39012A Strain of muscle, fascia and tendon of lower back, initial encounter: Secondary | ICD-10-CM

## 2015-01-14 MED ORDER — KETOROLAC TROMETHAMINE 60 MG/2ML IM SOLN
60.0000 mg | Freq: Once | INTRAMUSCULAR | Status: AC
  Administered 2015-01-14: 60 mg via INTRAMUSCULAR

## 2015-01-14 MED ORDER — KETOROLAC TROMETHAMINE 60 MG/2ML IM SOLN
INTRAMUSCULAR | Status: AC
  Filled 2015-01-14: qty 2

## 2015-01-14 MED ORDER — MELOXICAM 15 MG PO TABS: 15.0000 mg | ORAL_TABLET | Freq: Every day | ORAL | Status: DC

## 2015-01-14 MED ORDER — TIZANIDINE HCL 4 MG PO TABS: 4.0000 mg | ORAL_TABLET | Freq: Four times a day (QID) | ORAL | Status: DC | PRN

## 2015-01-14 NOTE — ED Notes (Signed)
Crackers given with Toradol shot to prevent stomach upset.
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
## 2015-01-14 NOTE — Discharge Instructions (Signed)
Do exercises twice daily followed by moist heat for 15 minutes. ° ° ° ° ° °Try to be as active as possible. ° °If no better in 2 weeks, follow up with orthopedist. ° ° °
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
## 2015-02-02 ENCOUNTER — Encounter (HOSPITAL_COMMUNITY): Payer: Self-pay

## 2015-02-02 ENCOUNTER — Emergency Department (HOSPITAL_COMMUNITY): Payer: BLUE CROSS/BLUE SHIELD

## 2015-02-02 ENCOUNTER — Emergency Department (HOSPITAL_COMMUNITY)
Admission: EM | Admit: 2015-02-02 | Discharge: 2015-02-02 | Disposition: A | Discharge status: Home / Self Care | Payer: BLUE CROSS/BLUE SHIELD | Attending: Emergency Medicine | Admitting: Emergency Medicine

## 2015-02-02 DIAGNOSIS — J45909 Unspecified asthma, uncomplicated: Secondary | ICD-10-CM | POA: Diagnosis not present

## 2015-02-02 DIAGNOSIS — M545 Low back pain: Secondary | ICD-10-CM | POA: Diagnosis not present

## 2015-02-02 DIAGNOSIS — R011 Cardiac murmur, unspecified: Secondary | ICD-10-CM | POA: Diagnosis not present

## 2015-02-02 DIAGNOSIS — Z79899 Other long term (current) drug therapy: Secondary | ICD-10-CM | POA: Diagnosis not present

## 2015-02-02 DIAGNOSIS — Z791 Long term (current) use of non-steroidal anti-inflammatories (NSAID): Secondary | ICD-10-CM | POA: Diagnosis not present

## 2015-02-02 MED ORDER — CYCLOBENZAPRINE HCL 10 MG PO TABS: 10.0000 mg | ORAL_TABLET | Freq: Two times a day (BID) | ORAL | Status: DC | PRN

## 2015-02-02 MED ORDER — TRAMADOL HCL 50 MG PO TABS: 50.0000 mg | ORAL_TABLET | Freq: Four times a day (QID) | ORAL | Status: DC | PRN

## 2015-02-02 MED ORDER — TRAMADOL HCL 50 MG PO TABS
50.0000 mg | ORAL_TABLET | Freq: Once | ORAL | Status: AC
  Administered 2015-02-02: 50 mg via ORAL
  Filled 2015-02-02: qty 1
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
## 2015-02-02 NOTE — Discharge Instructions (Signed)
Back Exercises These exercises may help you when beginning to rehabilitate your injury. Your symptoms may resolve with or without further involvement from your physician, physical therapist or athletic trainer. While completing these exercises, remember:   Restoring tissue flexibility helps normal motion to return to the joints. This allows healthier, less painful movement and activity.  An effective stretch should be held for at least 30 seconds.  A stretch should never be painful. You should only feel a gentle lengthening or release in the stretched tissue. STRETCH - Extension, Prone on Elbows   Lie on your stomach on the floor, a bed will be too soft. Place your palms about shoulder width apart and at the height of your head.  Place your elbows under your shoulders. If this is too painful, stack pillows under your chest.  Allow your body to relax so that your hips drop lower and make contact more completely with the floor.  Hold this position for __________ seconds.  Slowly return to lying flat on the floor. Repeat __________ times. Complete this exercise __________ times per day.  RANGE OF MOTION - Extension, Prone Press Ups   Lie on your stomach on the floor, a bed will be too soft. Place your palms about shoulder width apart and at the height of your head.  Keeping your back as relaxed as possible, slowly straighten your elbows while keeping your hips on the floor. You may adjust the placement of your hands to maximize your comfort. As you gain motion, your hands will come more underneath your shoulders.  Hold this position __________ seconds.  Slowly return to lying flat on the floor. Repeat __________ times. Complete this exercise __________ times per day.  RANGE OF MOTION- Quadruped, Neutral Spine   Assume a hands and knees position on a firm surface. Keep your hands under your shoulders and your knees under your hips. You may place padding under your knees for  comfort.  Drop your head and point your tail bone toward the ground below you. This will round out your low back like an angry cat. Hold this position for __________ seconds.  Slowly lift your head and release your tail bone so that your back sags into a large arch, like an old horse.  Hold this position for __________ seconds.  Repeat this until you feel limber in your low back.  Now, find your "sweet spot." This will be the most comfortable position somewhere between the two previous positions. This is your neutral spine. Once you have found this position, tense your stomach muscles to support your low back.  Hold this position for __________ seconds. Repeat __________ times. Complete this exercise __________ times per day.  STRETCH - Flexion, Single Knee to Chest   Lie on a firm bed or floor with both legs extended in front of you.  Keeping one leg in contact with the floor, bring your opposite knee to your chest. Hold your leg in place by either grabbing behind your thigh or at your knee.  Pull until you feel a gentle stretch in your low back. Hold __________ seconds.  Slowly release your grasp and repeat the exercise with the opposite side. Repeat __________ times. Complete this exercise __________ times per day.  STRETCH - Hamstrings, Standing  Stand or sit and extend your right / left leg, placing your foot on a chair or foot stool  Keeping a slight arch in your low back and your hips straight forward.  Lead with your chest and   lean forward at the waist until you feel a gentle stretch in the back of your right / left knee or thigh. (When done correctly, this exercise requires leaning only a small distance.)  Hold this position for __________ seconds. Repeat __________ times. Complete this stretch __________ times per day. STRENGTHENING - Deep Abdominals, Pelvic Tilt   Lie on a firm bed or floor. Keeping your legs in front of you, bend your knees so they are both pointed  toward the ceiling and your feet are flat on the floor.  Tense your lower abdominal muscles to press your low back into the floor. This motion will rotate your pelvis so that your tail bone is scooping upwards rather than pointing at your feet or into the floor.  With a gentle tension and even breathing, hold this position for __________ seconds. Repeat __________ times. Complete this exercise __________ times per day.  STRENGTHENING - Abdominals, Crunches   Lie on a firm bed or floor. Keeping your legs in front of you, bend your knees so they are both pointed toward the ceiling and your feet are flat on the floor. Cross your arms over your chest.  Slightly tip your chin down without bending your neck.  Tense your abdominals and slowly lift your trunk high enough to just clear your shoulder blades. Lifting higher can put excessive stress on the low back and does not further strengthen your abdominal muscles.  Control your return to the starting position. Repeat __________ times. Complete this exercise __________ times per day.  STRENGTHENING - Quadruped, Opposite UE/LE Lift   Assume a hands and knees position on a firm surface. Keep your hands under your shoulders and your knees under your hips. You may place padding under your knees for comfort.  Find your neutral spine and gently tense your abdominal muscles so that you can maintain this position. Your shoulders and hips should form a rectangle that is parallel with the floor and is not twisted.  Keeping your trunk steady, lift your right hand no higher than your shoulder and then your left leg no higher than your hip. Make sure you are not holding your breath. Hold this position __________ seconds.  Continuing to keep your abdominal muscles tense and your back steady, slowly return to your starting position. Repeat with the opposite arm and leg. Repeat __________ times. Complete this exercise __________ times per day. Document Released:  11/16/2005 Document Revised: 01/21/2012 Document Reviewed: 02/10/2009 [employer] Patient Information 2015 [employer]. This information is not intended to replace advice given to you by your health care provider. Make sure you discuss any questions you have with your health care provider.  
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
## 2015-02-14 ENCOUNTER — Encounter: Payer: Self-pay | Admitting: Family Medicine

## 2015-02-14 ENCOUNTER — Encounter (INDEPENDENT_AMBULATORY_CARE_PROVIDER_SITE_OTHER): Payer: Self-pay

## 2015-02-14 ENCOUNTER — Ambulatory Visit (INDEPENDENT_AMBULATORY_CARE_PROVIDER_SITE_OTHER): Payer: BLUE CROSS/BLUE SHIELD | Admitting: Family Medicine

## 2015-02-14 VITALS — BP 118/64 | HR 70 | Ht 72.0 in | Wt 185.0 lb

## 2015-02-14 DIAGNOSIS — M545 Low back pain, unspecified: Secondary | ICD-10-CM

## 2015-02-14 MED ORDER — DICLOFENAC SODIUM 75 MG PO TBEC: 75.0000 mg | DELAYED_RELEASE_TABLET | Freq: Two times a day (BID) | ORAL | Status: DC

## 2015-02-14 MED ORDER — HYDROCODONE-ACETAMINOPHEN 5-325 MG PO TABS: 1.0000 | ORAL_TABLET | Freq: Four times a day (QID) | ORAL | Status: DC | PRN

## 2015-02-14 MED ORDER — METHOCARBAMOL 500 MG PO TABS: 500.0000 mg | ORAL_TABLET | Freq: Four times a day (QID) | ORAL | Status: DC | PRN

## 2015-02-14 NOTE — Patient Instructions (Signed)
You have a lumbar strain. Take diclofenac twice a day with food for pain and inflammation. Norco as needed for severe pain (no driving on this medicine). Robaxin as needed for muscle spasms (no driving on this medicine if it makes you sleepy). Stay as active as possible. Physical therapy has been shown to be helpful as well. Do home exercises on days you don't go to therapy. Strengthening of low back muscles, abdominal musculature are key for long term pain relief. If not improving, will consider further imaging (MRI). Follow up with me in 5-6 weeks.

## 2015-02-17 DIAGNOSIS — M545 Low back pain, unspecified: Secondary | ICD-10-CM | POA: Insufficient documentation
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
## 2015-03-28 ENCOUNTER — Ambulatory Visit: Payer: PRIVATE HEALTH INSURANCE | Admitting: Family Medicine

## 2015-04-01 ENCOUNTER — Ambulatory Visit: Payer: BLUE CROSS/BLUE SHIELD | Admitting: Family Medicine

## 2015-04-07 ENCOUNTER — Ambulatory Visit: Payer: BLUE CROSS/BLUE SHIELD | Admitting: Family Medicine

## 2015-04-19 ENCOUNTER — Encounter: Payer: Self-pay | Admitting: Family Medicine

## 2015-04-19 ENCOUNTER — Ambulatory Visit (INDEPENDENT_AMBULATORY_CARE_PROVIDER_SITE_OTHER): Payer: BLUE CROSS/BLUE SHIELD | Admitting: Family Medicine

## 2015-04-19 VITALS — BP 132/74 | HR 49 | Ht 71.0 in | Wt 185.0 lb

## 2015-04-19 DIAGNOSIS — M545 Low back pain, unspecified: Secondary | ICD-10-CM
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
## 2015-05-31 ENCOUNTER — Encounter: Payer: Self-pay | Admitting: Family Medicine

## 2015-05-31 ENCOUNTER — Ambulatory Visit (INDEPENDENT_AMBULATORY_CARE_PROVIDER_SITE_OTHER): Payer: BLUE CROSS/BLUE SHIELD | Admitting: Family Medicine

## 2015-05-31 ENCOUNTER — Ambulatory Visit: Payer: BLUE CROSS/BLUE SHIELD | Admitting: Family Medicine

## 2015-05-31 VITALS — BP 123/73 | HR 114 | Ht 72.0 in | Wt 185.0 lb

## 2015-05-31 DIAGNOSIS — M545 Low back pain, unspecified: Secondary | ICD-10-CM
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
## 2015-06-13 ENCOUNTER — Telehealth: Payer: Self-pay | Admitting: Family Medicine

## 2015-06-13 NOTE — Telephone Encounter (Signed)
Spoke to patient and told him that we would do a letter stating that he is not on any restrictions for work and can return to full duty. Patient gave fax number to send letter.

## 2015-06-13 NOTE — Telephone Encounter (Signed)
Yes that's fine.  I thought the paperwork I filled out at last visit was to put him back to work full duty without restrictions.

## 2016-01-18 ENCOUNTER — Encounter (HOSPITAL_COMMUNITY): Payer: Self-pay | Admitting: Emergency Medicine

## 2016-01-18 ENCOUNTER — Emergency Department (HOSPITAL_COMMUNITY)
Admission: EM | Admit: 2016-01-18 | Discharge: 2016-01-18 | Disposition: A | Discharge status: Home / Self Care | Payer: BLUE CROSS/BLUE SHIELD | Attending: Emergency Medicine | Admitting: Emergency Medicine

## 2016-01-18 DIAGNOSIS — M546 Pain in thoracic spine: Secondary | ICD-10-CM

## 2016-01-18 DIAGNOSIS — Y92410 Unspecified street and highway as the place of occurrence of the external cause: Secondary | ICD-10-CM | POA: Insufficient documentation

## 2016-01-18 DIAGNOSIS — Z791 Long term (current) use of non-steroidal anti-inflammatories (NSAID): Secondary | ICD-10-CM | POA: Diagnosis not present

## 2016-01-18 DIAGNOSIS — R011 Cardiac murmur, unspecified: Secondary | ICD-10-CM | POA: Insufficient documentation

## 2016-01-18 DIAGNOSIS — S29002A Unspecified injury of muscle and tendon of back wall of thorax, initial encounter: Secondary | ICD-10-CM | POA: Diagnosis not present

## 2016-01-18 DIAGNOSIS — Y998 Other external cause status: Secondary | ICD-10-CM | POA: Insufficient documentation

## 2016-01-18 DIAGNOSIS — Y9389 Activity, other specified: Secondary | ICD-10-CM | POA: Diagnosis not present

## 2016-01-18 DIAGNOSIS — J45909 Unspecified asthma, uncomplicated: Secondary | ICD-10-CM | POA: Diagnosis not present

## 2016-01-18 DIAGNOSIS — Z79899 Other long term (current) drug therapy: Secondary | ICD-10-CM | POA: Insufficient documentation

## 2016-01-18 DIAGNOSIS — S3992XA Unspecified injury of lower back, initial encounter: Secondary | ICD-10-CM | POA: Diagnosis not present

## 2016-01-18 MED ORDER — KETOROLAC TROMETHAMINE 30 MG/ML IJ SOLN
30.0000 mg | Freq: Once | INTRAMUSCULAR | Status: AC
  Administered 2016-01-18: 30 mg via INTRAMUSCULAR
  Filled 2016-01-18: qty 1

## 2016-01-18 MED ORDER — METHOCARBAMOL 500 MG PO TABS
1000.0000 mg | ORAL_TABLET | Freq: Once | ORAL | Status: AC
  Administered 2016-01-18: 1000 mg via ORAL
  Filled 2016-01-18: qty 2

## 2016-01-18 MED ORDER — IBUPROFEN 800 MG PO TABS: 800.0000 mg | ORAL_TABLET | Freq: Three times a day (TID) | ORAL | Status: DC

## 2016-01-18 MED ORDER — METHOCARBAMOL 500 MG PO TABS
500.0000 mg | ORAL_TABLET | Freq: Two times a day (BID) | ORAL | Status: DC
Start: 2016-01-18 — End: 2016-07-13

## 2016-01-18 MED ORDER — METHOCARBAMOL 1000 MG/10ML IJ SOLN
1000.0000 mg | Freq: Once | INTRAMUSCULAR | Status: DC
  Filled 2016-01-18: qty 10

## 2016-01-18 NOTE — Discharge Instructions (Signed)
Take your medications as prescribed. I recommend taking your ibuprofen after you have had something to eat prevent GI side effects. He may also apply ice to affected area for 15-20 minutes 3-4 times daily for the next 24 hours, he may then apply heat for pain relief. Refrain from doing any heavy lifting, repetitive movements or exercising that exacerbate your symptoms for the next few days. Please follow up with a primary care provider from the Resource Guide provided below in 1 week as needed. Please return to the Emergency Department if symptoms worsen or new onset of fever, numbness, tingling, groin anesthesia, loss of bowel or bladder, weakness.

## 2016-01-18 NOTE — ED Notes (Signed)
Patient was alert, oriented and stable upon discharge. RN went over AVS and patient had no further questions.  
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
## 2016-07-13 ENCOUNTER — Ambulatory Visit (INDEPENDENT_AMBULATORY_CARE_PROVIDER_SITE_OTHER): Payer: BLUE CROSS/BLUE SHIELD | Admitting: Family Medicine

## 2016-07-13 ENCOUNTER — Encounter: Payer: Self-pay | Admitting: Family Medicine

## 2016-07-13 DIAGNOSIS — M545 Low back pain, unspecified: Secondary | ICD-10-CM

## 2016-07-13 MED ORDER — DICLOFENAC SODIUM 75 MG PO TBEC: 75.0000 mg | DELAYED_RELEASE_TABLET | Freq: Two times a day (BID) | ORAL | 1 refills | Status: DC

## 2016-07-13 MED ORDER — METHOCARBAMOL 500 MG PO TABS: 500.0000 mg | ORAL_TABLET | Freq: Three times a day (TID) | ORAL | 1 refills | Status: DC | PRN

## 2016-07-13 MED ORDER — HYDROCODONE-ACETAMINOPHEN 5-325 MG PO TABS: 1.0000 | ORAL_TABLET | Freq: Four times a day (QID) | ORAL | 0 refills | Status: DC | PRN
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
## 2016-07-18 ENCOUNTER — Telehealth: Payer: Self-pay | Admitting: *Deleted

## 2016-07-18 NOTE — Telephone Encounter (Signed)
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  Yes, I think it's reasonable to increase to 9 hours in a couple weeks - if he needs a letter for this we can write one but would need to know start date of that.
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
## 2016-07-18 NOTE — Telephone Encounter (Signed)
Patient called and said that his manager at work needs a letter giving a list of what restrictions, limitations and capabilities would the patient need to be on at work. Also needs to know if work hours would increase from 8 to 9 in 2 weeks.

## 2016-07-19 NOTE — Telephone Encounter (Signed)
Letter written

## 2016-07-19 NOTE — Telephone Encounter (Signed)
Spoke to patient and gave him information provided by the physician. Patient stated that he would need a letter that the hours will increase to 9 in a couple of weeks. Patient gave the start date as 07-30-16.

## 2016-08-13 ENCOUNTER — Ambulatory Visit: Payer: BLUE CROSS/BLUE SHIELD | Admitting: Family Medicine

## 2016-08-13 ENCOUNTER — Ambulatory Visit (INDEPENDENT_AMBULATORY_CARE_PROVIDER_SITE_OTHER): Payer: BLUE CROSS/BLUE SHIELD | Admitting: Family Medicine

## 2016-08-13 ENCOUNTER — Encounter: Payer: Self-pay | Admitting: Family Medicine

## 2016-08-13 DIAGNOSIS — M545 Low back pain, unspecified: Secondary | ICD-10-CM
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
## 2019-02-01 ENCOUNTER — Emergency Department (HOSPITAL_COMMUNITY): Payer: No Typology Code available for payment source

## 2019-02-01 ENCOUNTER — Emergency Department (HOSPITAL_COMMUNITY)
Admission: EM | Admit: 2019-02-01 | Discharge: 2019-02-01 | Disposition: A | Discharge status: Home / Self Care | Payer: No Typology Code available for payment source | Attending: Emergency Medicine | Admitting: Emergency Medicine

## 2019-02-01 ENCOUNTER — Other Ambulatory Visit: Payer: Self-pay

## 2019-02-01 ENCOUNTER — Encounter (HOSPITAL_COMMUNITY): Payer: Self-pay

## 2019-02-01 DIAGNOSIS — Y998 Other external cause status: Secondary | ICD-10-CM | POA: Insufficient documentation

## 2019-02-01 DIAGNOSIS — T07XXXA Unspecified multiple injuries, initial encounter: Secondary | ICD-10-CM

## 2019-02-01 DIAGNOSIS — Z23 Encounter for immunization: Secondary | ICD-10-CM | POA: Insufficient documentation

## 2019-02-01 DIAGNOSIS — J45909 Unspecified asthma, uncomplicated: Secondary | ICD-10-CM | POA: Insufficient documentation

## 2019-02-01 DIAGNOSIS — S60512A Abrasion of left hand, initial encounter: Secondary | ICD-10-CM | POA: Insufficient documentation

## 2019-02-01 DIAGNOSIS — S80212A Abrasion, left knee, initial encounter: Secondary | ICD-10-CM | POA: Insufficient documentation

## 2019-02-01 DIAGNOSIS — S80211A Abrasion, right knee, initial encounter: Secondary | ICD-10-CM | POA: Insufficient documentation

## 2019-02-01 DIAGNOSIS — Y939 Activity, unspecified: Secondary | ICD-10-CM | POA: Insufficient documentation

## 2019-02-01 DIAGNOSIS — Y92410 Unspecified street and highway as the place of occurrence of the external cause: Secondary | ICD-10-CM | POA: Diagnosis not present

## 2019-02-01 DIAGNOSIS — T1490XA Injury, unspecified, initial encounter: Secondary | ICD-10-CM

## 2019-02-01 DIAGNOSIS — S30811A Abrasion of abdominal wall, initial encounter: Secondary | ICD-10-CM | POA: Insufficient documentation

## 2019-02-01 DIAGNOSIS — T148XXA Other injury of unspecified body region, initial encounter: Secondary | ICD-10-CM | POA: Diagnosis not present

## 2019-02-01 DIAGNOSIS — S60511A Abrasion of right hand, initial encounter: Secondary | ICD-10-CM | POA: Insufficient documentation

## 2019-02-01 LAB — CBC WITH DIFFERENTIAL/PLATELET
Abs Immature Granulocytes: 0.02 10*3/uL (ref 0.00–0.07)
Basophils Absolute: 0 10*3/uL (ref 0.0–0.1)
Basophils Relative: 0 %
EOS PCT: 1 %
Eosinophils Absolute: 0.1 10*3/uL (ref 0.0–0.5)
HCT: 41.2 % (ref 39.0–52.0)
Hemoglobin: 13.8 g/dL (ref 13.0–17.0)
Immature Granulocytes: 0 %
Lymphocytes Relative: 34 %
Lymphs Abs: 3.4 10*3/uL (ref 0.7–4.0)
MCH: 29.4 pg (ref 26.0–34.0)
MCHC: 33.5 g/dL (ref 30.0–36.0)
MCV: 87.7 fL (ref 80.0–100.0)
Monocytes Absolute: 0.6 10*3/uL (ref 0.1–1.0)
Monocytes Relative: 6 %
Neutro Abs: 5.8 10*3/uL (ref 1.7–7.7)
Neutrophils Relative %: 59 %
Platelets: 236 10*3/uL (ref 150–400)
RBC: 4.7 MIL/uL (ref 4.22–5.81)
RDW: 12.4 % (ref 11.5–15.5)
WBC: 10 10*3/uL (ref 4.0–10.5)
nRBC: 0 % (ref 0.0–0.2)

## 2019-02-01 LAB — COMPREHENSIVE METABOLIC PANEL
ALT: 16 U/L (ref 0–44)
AST: 26 U/L (ref 15–41)
Albumin: 4.5 g/dL (ref 3.5–5.0)
Alkaline Phosphatase: 55 U/L (ref 38–126)
Anion gap: 14 (ref 5–15)
BILIRUBIN TOTAL: 0.6 mg/dL (ref 0.3–1.2)
BUN: 8 mg/dL (ref 6–20)
CO2: 22 mmol/L (ref 22–32)
Calcium: 9.2 mg/dL (ref 8.9–10.3)
Chloride: 104 mmol/L (ref 98–111)
Creatinine, Ser: 1.07 mg/dL (ref 0.61–1.24)
GFR calc Af Amer: >60 mL/min (ref >60)
GFR calc non Af Amer: >60 mL/min (ref >60)
Glucose, Bld: 134 mg/dL — ABNORMAL HIGH (ref 70–99)
Potassium: 3.8 mmol/L (ref 3.5–5.1)
Sodium: 140 mmol/L (ref 135–145)
TOTAL PROTEIN: 7.3 g/dL (ref 6.5–8.1)

## 2019-02-01 LAB — CBG MONITORING, ED: Glucose-Capillary: 124 mg/dL — ABNORMAL HIGH (ref 70–99)

## 2019-02-01 MED ORDER — FENTANYL CITRATE (PF) 100 MCG/2ML IJ SOLN
50.0000 ug | Freq: Once | INTRAMUSCULAR | Status: AC
  Administered 2019-02-01: 50 ug via INTRAVENOUS
  Filled 2019-02-01: qty 2

## 2019-02-01 MED ORDER — TETANUS-DIPHTH-ACELL PERTUSSIS 5-2.5-18.5 LF-MCG/0.5 IM SUSP
0.5000 mL | Freq: Once | INTRAMUSCULAR | Status: AC
  Administered 2019-02-01: 0.5 mL via INTRAMUSCULAR
  Filled 2019-02-01: qty 0.5

## 2019-02-01 MED ORDER — IOHEXOL 300 MG/ML  SOLN
100.0000 mL | Freq: Once | INTRAMUSCULAR | Status: AC | PRN
  Administered 2019-02-01: 100 mL via INTRAVENOUS

## 2019-02-01 MED ORDER — ACETAMINOPHEN 325 MG PO TABS: 650.0000 mg | ORAL_TABLET | Freq: Four times a day (QID) | ORAL | 0 refills | Status: DC | PRN

## 2019-02-01 MED ORDER — BACITRACIN ZINC 500 UNIT/GM EX OINT
TOPICAL_OINTMENT | Freq: Two times a day (BID) | CUTANEOUS | Status: DC
  Filled 2019-02-01: qty 3.6

## 2019-02-01 NOTE — ED Notes (Signed)
Pt discharged from ED; instructions provided and scripts given; Pt encouraged to return to ED if symptoms worsen and to f/u with PCP; Pt verbalized understanding of all instructions 
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
## 2019-03-25 ENCOUNTER — Encounter (HOSPITAL_COMMUNITY): Payer: Self-pay | Admitting: Emergency Medicine

## 2019-03-25 ENCOUNTER — Ambulatory Visit (HOSPITAL_COMMUNITY)
Admission: EM | Admit: 2019-03-25 | Discharge: 2019-03-25 | Disposition: A | Discharge status: Home / Self Care | Payer: Self-pay | Attending: Family Medicine | Admitting: Family Medicine

## 2019-03-25 ENCOUNTER — Other Ambulatory Visit: Payer: Self-pay

## 2019-03-25 DIAGNOSIS — B9789 Other viral agents as the cause of diseases classified elsewhere: Secondary | ICD-10-CM

## 2019-03-25 DIAGNOSIS — R05 Cough: Secondary | ICD-10-CM

## 2019-03-25 DIAGNOSIS — J069 Acute upper respiratory infection, unspecified: Secondary | ICD-10-CM

## 2019-03-25 MED ORDER — BENZONATATE 100 MG PO CAPS: ORAL_CAPSULE | ORAL | 0 refills | Status: DC

## 2019-03-25 NOTE — Discharge Instructions (Signed)
Follow up with your primary care doctor or here if you are not seeing improvement of your symptoms over the next several days, sooner if you feel you are worsening. ° °Caring for yourself: °Get plenty of rest. °Drink plenty of fluids, enough so that your urine is light yellow or clear like water. If you have kidney, heart, or liver disease and have to limit fluids, talk with your doctor before you increase the amount of fluids you drink. °Take an over-the-counter pain medicine if needed, such as acetaminophen (Tylenol), ibuprofen (Advil, Motrin), or naproxen (Aleve), to relieve fever, headache, and muscle aches. Read and follow all instructions on the label. No one younger than 20 should take aspirin. It has been linked to Reye syndrome, a serious illness. °Before you use over the counter cough and cold medicines, check the label. These medicines may not be safe for children younger than age 6 or for people with certain health problems. °If the skin around your nose and lips becomes sore, put some petroleum jelly on the area. ° °Avoid spreading a respiratory virus: °Wash your hands regularly, and keep your hands away from your face.  °Stay home from school, work, and other public places until you are feeling better and your fever has been gone for at least 24 hours. The fever needs to have gone away on its own without the help of medicine. °

## 2019-03-25 NOTE — ED Triage Notes (Signed)
Pt here for cough and URI sx x 2 days; pt sts pain with cough and inspiration

## 2019-03-30 ENCOUNTER — Other Ambulatory Visit: Payer: Self-pay

## 2019-03-30 ENCOUNTER — Ambulatory Visit (INDEPENDENT_AMBULATORY_CARE_PROVIDER_SITE_OTHER): Payer: Self-pay

## 2019-03-30 ENCOUNTER — Encounter (HOSPITAL_COMMUNITY): Payer: Self-pay

## 2019-03-30 ENCOUNTER — Ambulatory Visit (HOSPITAL_COMMUNITY)
Admission: EM | Admit: 2019-03-30 | Discharge: 2019-03-30 | Disposition: A | Discharge status: Home / Self Care | Payer: Self-pay | Attending: Family Medicine | Admitting: Family Medicine

## 2019-03-30 DIAGNOSIS — R059 Cough, unspecified: Secondary | ICD-10-CM

## 2019-03-30 DIAGNOSIS — R05 Cough: Secondary | ICD-10-CM

## 2019-03-30 MED ORDER — ALBUTEROL SULFATE HFA 108 (90 BASE) MCG/ACT IN AERS
2.0000 | INHALATION_SPRAY | Freq: Once | RESPIRATORY_TRACT | Status: AC
  Administered 2019-03-30: 2 via RESPIRATORY_TRACT

## 2019-03-30 MED ORDER — ALBUTEROL SULFATE HFA 108 (90 BASE) MCG/ACT IN AERS
INHALATION_SPRAY | RESPIRATORY_TRACT | Status: AC
  Filled 2019-03-30: qty 6.7

## 2019-03-30 MED ORDER — BENZONATATE 100 MG PO CAPS: ORAL_CAPSULE | ORAL | 0 refills | Status: DC

## 2019-03-30 NOTE — ED Provider Notes (Signed)
[facility]    CSN: [id number] Arrival date & time: 03/30/19  1241     History   Chief Complaint Chief Complaint  Patient presents with  . Letter for School/Work    HPI Carl [name] is a 28 y.o. male.   Pt is a 28 year old male that presents with continued cough, chest pain. He was seen here a week ago and diagnosed with viral illness.  He has been taking the cough medication as prescribed.  He has been using his sisters albuterol inhaler.  This does help some with his symptoms. He is  concerned about returning to work. Reports that they are very strict and will not let him continue to work with any symptoms. No fever. Mild sore throat.  ROS per HPI      Past Medical History:  Diagnosis Date  . Asthma   . Heart murmur     Patient Active Problem List   Diagnosis Date Noted  . Low back pain 02/17/2015    History reviewed. No pertinent surgical history.     Home Medications    Prior to Admission medications   Medication Sig Start Date End Date Taking? Authorizing Provider  acetaminophen (TYLENOL) 325 MG tablet Take 2 tablets (650 mg total) by mouth every 6 (six) hours as needed. 02/01/19   Derwood KaplanNanavati, Ankit, MD  albuterol (PROVENTIL HFA;VENTOLIN HFA) 108 (90 BASE) MCG/ACT inhaler Inhale 1-2 puffs into the lungs every 6 (six) hours as needed for wheezing or shortness of breath. Patient not taking: Reported on 02/01/2019 12/24/14   Tilden Fossaees, Elizabeth, MD  benzonatate (TESSALON) 100 MG capsule Take 1 capsule by mouth every 8 (eight) hours for cough. 03/30/19   Dahlia ByesBast, Meeyah Ovitt A, NP  HYDROcodone-acetaminophen (NORCO) 5-325 MG tablet Take 1 tablet by mouth every 6 (six) hours as needed for moderate pain. Patient not taking: Reported on 02/01/2019 07/13/16   Lenda KelpHudnall, Shane R, MD  omeprazole (PRILOSEC) 20 MG capsule Take 1 capsule (20 mg total) by mouth daily. Patient not taking: Reported on 02/01/2019 12/24/14   Tilden Fossaees, Elizabeth, MD    Family History History reviewed.  No pertinent family history.  Social History Social History   Tobacco Use  . Smoking status: Never Smoker  . Smokeless tobacco: Never Used  Substance Use Topics  . Alcohol use: Yes    Alcohol/week: 0.0 standard drinks    Comment: social  . Drug use: No     Allergies   Nsaids   Review of Systems Review of Systems   Physical Exam Triage Vital Signs ED Triage Vitals  Enc Vitals Group     BP 03/30/19 1325 (!) 113/94     Pulse Rate 03/30/19 1325 61     Resp 03/30/19 1325 18     Temp 03/30/19 1325 98.6 F (37 C)     Temp Source 03/30/19 1325 Oral     SpO2 03/30/19 1325 97 %     Weight --      Height --      Head Circumference --      Peak Flow --      Pain Score 03/30/19 1323 6     Pain Loc --      Pain Edu? --      Excl. in GC? --    No data found.  Updated Vital Signs BP (!) 113/94 (BP Location: Left Arm)   Pulse 61   Temp 98.6 F (37 C) (Oral)   Resp 18   SpO2  97%   Visual Acuity Right Eye Distance:   Left Eye Distance:   Bilateral Distance:    Right Eye Near:   Left Eye Near:    Bilateral Near:     Physical Exam Vitals signs and nursing note reviewed.  Constitutional:      Appearance: He is well-developed.  HENT:     Head: Normocephalic and atraumatic.  Eyes:     Conjunctiva/sclera: Conjunctivae normal.  Neck:     Musculoskeletal: Neck supple.  Cardiovascular:     Rate and Rhythm: Normal rate and regular rhythm.     Heart sounds: No murmur.  Pulmonary:     Effort: Pulmonary effort is normal. No respiratory distress.     Breath sounds: Normal breath sounds.  Musculoskeletal: Normal range of motion.  Skin:    General: Skin is warm and dry.  Neurological:     Mental Status: He is alert.  Psychiatric:        Mood and Affect: Mood normal.      UC Treatments / Results  Labs (all labs ordered are listed, but only abnormal results are displayed) Labs Reviewed - No data to display  EKG None  Radiology Dg Chest 2 View  Result  Date: 03/30/2019 CLINICAL DATA:  Chest pain, cough EXAM: CHEST - 2 VIEW COMPARISON:  None. FINDINGS: The heart size and mediastinal contours are within normal limits. Both lungs are clear. The visualized skeletal structures are unremarkable. IMPRESSION: No active cardiopulmonary disease. Electronically Signed   By: Elige Ko   On: 03/30/2019 14:28    Procedures Procedures (including critical care time)  Medications Ordered in UC Medications  albuterol (VENTOLIN HFA) 108 (90 Base) MCG/ACT inhaler 2 puff (2 puffs Inhalation Given 03/30/19 1500)    Initial Impression / Assessment and Plan / UC Course  I have reviewed the triage vital signs and the nursing notes.  Pertinent labs & imaging results that were available during my care of the patient were reviewed by me and considered in my medical decision making (see chart for details).    Cough  Chest x ray negative for any acute abnormalities Most likely lingering symptoms from viral illness Refill patient's cough medicine and gave albuterol inhaler here in clinic. Work note extended Follow up as needed for continued or worsening symptoms    Final Clinical Impressions(s) / UC Diagnoses   Final diagnoses:  Cough     Discharge Instructions     Your chest x ray was negative  Refilled your cough medication and inhaler given here in clinic.  Work note extension Follow up as needed for continued or worsening symptoms     ED Prescriptions    Medication Sig Dispense Auth. Provider   benzonatate (TESSALON) 100 MG capsule Take 1 capsule by mouth every 8 (eight) hours for cough. 21 capsule Dahlia Byes A, NP     Controlled Substance Prescriptions [place] Controlled Substance Registry consulted? Not Applicable   Janace Aris, NP 03/30/19 1517

## 2019-03-30 NOTE — Discharge Instructions (Addendum)
Your chest x ray was negative  Refilled your cough medication and inhaler given here in clinic.  Work note extension Follow up as needed for continued or worsening symptoms

## 2019-03-30 NOTE — ED Triage Notes (Signed)
Patient presents to Urgent Care with complaints of needing a letter to remain out of work since his sinus congestion and chest pains with coughing have not improved since he was seen here last week. Patient reports he feels better, but because he still has some symptoms he needs to stay out of work longer.

## 2019-04-02 ENCOUNTER — Other Ambulatory Visit: Payer: Self-pay

## 2019-04-02 ENCOUNTER — Ambulatory Visit (HOSPITAL_COMMUNITY)
Admission: EM | Admit: 2019-04-02 | Discharge: 2019-04-02 | Disposition: A | Discharge status: Home / Self Care | Payer: Self-pay | Attending: Family Medicine | Admitting: Family Medicine

## 2019-04-02 ENCOUNTER — Encounter (HOSPITAL_COMMUNITY): Payer: Self-pay | Admitting: Emergency Medicine

## 2019-04-02 DIAGNOSIS — Z7689 Persons encountering health services in other specified circumstances: Secondary | ICD-10-CM

## 2019-04-02 DIAGNOSIS — Z0289 Encounter for other administrative examinations: Secondary | ICD-10-CM

## 2019-04-02 NOTE — Discharge Instructions (Addendum)
We will extend the work note till Monday.  Follow up as needed for continued or worsening symptoms

## 2019-04-02 NOTE — ED Provider Notes (Signed)
[facility]    CSN: [id number] Arrival date & time: 04/02/19  1421     History   Chief Complaint Chief Complaint  Patient presents with  . Cough  . Letter for School/Work    HPI Carl [name] is a 28 y.o. male.   Pt is a 28 year old male that presents needing work note extension. I saw him 3 days ago. He is still coughing although felling better. Was told that he could not come back to work until symptom free. He has been taking the medication that we prescribed. No fever, chills, headache.   ROS per HPI      Past Medical History:  Diagnosis Date  . Asthma   . Heart murmur     Patient Active Problem List   Diagnosis Date Noted  . Low back pain 02/17/2015    History reviewed. No pertinent surgical history.     Home Medications    Prior to Admission medications   Medication Sig Start Date End Date Taking? Authorizing Provider  acetaminophen (TYLENOL) 325 MG tablet Take 2 tablets (650 mg total) by mouth every 6 (six) hours as needed. 02/01/19   Derwood Kaplan, MD  albuterol (PROVENTIL HFA;VENTOLIN HFA) 108 (90 BASE) MCG/ACT inhaler Inhale 1-2 puffs into the lungs every 6 (six) hours as needed for wheezing or shortness of breath. Patient not taking: Reported on 02/01/2019 12/24/14   Tilden Fossa, MD  benzonatate (TESSALON) 100 MG capsule Take 1 capsule by mouth every 8 (eight) hours for cough. 03/30/19   Dahlia Byes A, NP  HYDROcodone-acetaminophen (NORCO) 5-325 MG tablet Take 1 tablet by mouth every 6 (six) hours as needed for moderate pain. Patient not taking: Reported on 02/01/2019 07/13/16   Lenda Kelp, MD  omeprazole (PRILOSEC) 20 MG capsule Take 1 capsule (20 mg total) by mouth daily. Patient not taking: Reported on 02/01/2019 12/24/14   Tilden Fossa, MD    Family History History reviewed. No pertinent family history.  Social History Social History   Tobacco Use  . Smoking status: Never Smoker  . Smokeless tobacco: Never Used   Substance Use Topics  . Alcohol use: Yes    Alcohol/week: 0.0 standard drinks    Comment: social  . Drug use: No     Allergies   Nsaids   Review of Systems Review of Systems   Physical Exam Triage Vital Signs ED Triage Vitals  Enc Vitals Group     BP 04/02/19 1440 118/61     Pulse Rate 04/02/19 1440 69     Resp 04/02/19 1440 16     Temp 04/02/19 1440 98.2 F (36.8 C)     Temp src --      SpO2 04/02/19 1440 100 %     Weight --      Height --      Head Circumference --      Peak Flow --      Pain Score 04/02/19 1457 0     Pain Loc --      Pain Edu? --      Excl. in GC? --    No data found.  Updated Vital Signs BP 118/61   Pulse 69   Temp 98.2 F (36.8 C)   Resp 16   SpO2 100%   Visual Acuity Right Eye Distance:   Left Eye Distance:   Bilateral Distance:    Right Eye Near:   Left Eye Near:    Bilateral Near:  Physical Exam Vitals signs and nursing note reviewed.  Constitutional:      General: He is not in acute distress.    Appearance: Normal appearance. He is well-developed. He is not toxic-appearing or diaphoretic.  HENT:     Head: Normocephalic and atraumatic.     Right Ear: Tympanic membrane and ear canal normal.     Left Ear: Tympanic membrane and ear canal normal.     Nose: Nose normal.     Mouth/Throat:     Pharynx: Oropharynx is clear.  Eyes:     Conjunctiva/sclera: Conjunctivae normal.  Neck:     Musculoskeletal: Neck supple.  Cardiovascular:     Rate and Rhythm: Normal rate and regular rhythm.     Heart sounds: No murmur.  Pulmonary:     Effort: Pulmonary effort is normal. No respiratory distress.     Breath sounds: Normal breath sounds.  Musculoskeletal: Normal range of motion.  Skin:    General: Skin is warm and dry.  Neurological:     Mental Status: He is alert.  Psychiatric:        Mood and Affect: Mood normal.      UC Treatments / Results  Labs (all labs ordered are listed, but only abnormal results are  displayed) Labs Reviewed - No data to display  EKG None  Radiology No results found.  Procedures Procedures (including critical care time)  Medications Ordered in UC Medications - No data to display  Initial Impression / Assessment and Plan / UC Course  I have reviewed the triage vital signs and the nursing notes.  Pertinent labs & imaging results that were available during my care of the patient were reviewed by me and considered in my medical decision making (see chart for details).     Pt appears well.  Will extend his work note until Monday and then he should be safe to return.  Use the medication as needed.  Follow up as needed for continued or worsening symptoms   Final Clinical Impressions(s) / UC Diagnoses   Final diagnoses:  Return to work evaluation     Discharge Instructions     We will extend the work note till Monday.  Follow up as needed for continued or worsening symptoms     ED Prescriptions    None     Controlled Substance Prescriptions [place] Controlled Substance Registry consulted? Not Applicable   Janace ArisBast, Janazia Schreier A, NP 04/02/19 1601

## 2019-04-02 NOTE — ED Triage Notes (Signed)
Pt states he doesn't feel 100% back from normal, states he coughed yesterday and his job wants him to have another note since he still has symptoms. Pt states "I feel like I was dying yetserday". Pt also c/o diarrhea.

## 2019-04-08 NOTE — ED Provider Notes (Signed)
[facility]   [id number] 03/25/19 Arrival Time: 1118  ASSESSMENT & PLAN:  1. Viral URI with cough    Work note provided. See AVS for discharge instructions.  Meds ordered this encounter  Medications  . DISCONTD: benzonatate (TESSALON) 100 MG capsule    Sig: Take 1 capsule by mouth every 8 (eight) hours for cough.    Dispense:  21 capsule    Refill:  0   Discussed typical duration of symptoms. OTC symptom care as needed. Ensure adequate fluid intake and rest. May f/u with PCP or here as needed.  Reviewed expectations re: course of current medical issues. Questions answered. Outlined signs and symptoms indicating need for more acute intervention. Patient verbalized understanding. After Visit Summary given.   SUBJECTIVE: History from: patient.  Carl [name] is a 28 y.o. male who presents with complaint of nasal congestion, post-nasal drainage, and a persistent dry cough; without sore throat. Onset abrupt, about 2 days ago; with mild fatigue and without body aches. SOB: none. Wheezing: none. Reports chest soreness from persistent coughing. Fever: none suspected. Overall normal PO intake without n/v. Known sick contacts: unsure. No specific or significant aggravating or alleviating factors reported. OTC treatment: none reported.  Social History   Tobacco Use  Smoking Status Never Smoker  Smokeless Tobacco Never Used   ROS: As per HPI.   OBJECTIVE:  Vitals:   03/25/19 1208  BP: 130/71  Pulse: 62  Resp: 18  Temp: 97.7 F (36.5 C)  TempSrc: Temporal  SpO2: 98%    General appearance: alert; appears fatigued HEENT: nasal congestion; clear runny nose; throat irritation secondary to post-nasal drainage Neck: supple without LAD CV: RRR Lungs: unlabored respirations, symmetrical air entry without wheezing; cough: mild and dry Abd: soft Ext: no LE edema Skin: warm and dry Psychological: alert and cooperative; normal mood and affect    Allergies   Allergen Reactions  . Nsaids Nausea And Vomiting    Can't take aleve but can take ibuprofen     Past Medical History:  Diagnosis Date  . Asthma   . Heart murmur     Social History   Socioeconomic History  . Marital status: Single    Spouse name: Not on file  . Number of children: Not on file  . Years of education: Not on file  . Highest education level: Not on file  Occupational History  . Not on file  Social Needs  . Financial resource strain: Not on file  . Food insecurity:    Worry: Not on file    Inability: Not on file  . Transportation needs:    Medical: Not on file    Non-medical: Not on file  Tobacco Use  . Smoking status: Never Smoker  . Smokeless tobacco: Never Used  Substance and Sexual Activity  . Alcohol use: Yes    Alcohol/week: 0.0 standard drinks    Comment: social  . Drug use: No  . Sexual activity: Not on file  Lifestyle  . Physical activity:    Days per week: Not on file    Minutes per session: Not on file  . Stress: Not on file  Relationships  . Social connections:    Talks on phone: Not on file    Gets together: Not on file    Attends religious service: Not on file    Active member of club or organization: Not on file    Attends meetings of clubs or organizations: Not on file  Relationship status: Not on file  . Intimate partner violence:    Fear of current or ex partner: Not on file    Emotionally abused: Not on file    Physically abused: Not on file    Forced sexual activity: Not on file  Other Topics Concern  . Not on file  Social History Narrative  . Not on file           Mardella LaymanHagler, Carl Lunden, MD 04/08/19 [id number]

## 2019-04-17 ENCOUNTER — Other Ambulatory Visit: Payer: Self-pay

## 2019-04-17 ENCOUNTER — Encounter (HOSPITAL_COMMUNITY): Payer: Self-pay

## 2019-04-17 ENCOUNTER — Emergency Department (HOSPITAL_COMMUNITY)
Admission: EM | Admit: 2019-04-17 | Discharge: 2019-04-17 | Disposition: A | Discharge status: Home / Self Care | Payer: Self-pay | Attending: Emergency Medicine | Admitting: Emergency Medicine

## 2019-04-17 DIAGNOSIS — J45909 Unspecified asthma, uncomplicated: Secondary | ICD-10-CM | POA: Insufficient documentation

## 2019-04-17 DIAGNOSIS — R350 Frequency of micturition: Secondary | ICD-10-CM | POA: Insufficient documentation

## 2019-04-17 DIAGNOSIS — R82998 Other abnormal findings in urine: Secondary | ICD-10-CM | POA: Insufficient documentation

## 2019-04-17 LAB — URINALYSIS, ROUTINE W REFLEX MICROSCOPIC
Bilirubin Urine: NEGATIVE
Glucose, UA: NEGATIVE mg/dL
Hgb urine dipstick: NEGATIVE
Ketones, ur: 5 mg/dL — AB
Nitrite: NEGATIVE
Protein, ur: 30 mg/dL — AB
Specific Gravity, Urine: 1.034 — ABNORMAL HIGH (ref 1.005–1.030)
pH: 5 (ref 5.0–8.0)

## 2019-04-17 MED ORDER — CEFTRIAXONE SODIUM 250 MG IJ SOLR
250.0000 mg | Freq: Once | INTRAMUSCULAR | Status: AC
  Administered 2019-04-17: 250 mg via INTRAMUSCULAR
  Filled 2019-04-17: qty 250

## 2019-04-17 MED ORDER — STERILE WATER FOR INJECTION IJ SOLN
INTRAMUSCULAR | Status: AC
  Administered 2019-04-17: 10 mL
  Filled 2019-04-17: qty 10

## 2019-04-17 MED ORDER — AZITHROMYCIN 250 MG PO TABS
1000.0000 mg | ORAL_TABLET | Freq: Once | ORAL | Status: AC
  Administered 2019-04-17: 1000 mg via ORAL
  Filled 2019-04-17: qty 4

## 2019-04-17 NOTE — ED Triage Notes (Signed)
Pt states discomfort when urinating. Pt states he has had new sexual partners recently.

## 2019-04-17 NOTE — ED Notes (Signed)
Pt is aware a urine sample is needed. Urinal at bedside. Pt given water per request to help him urinate. Pt reports he will notify staff when he voids.

## 2019-04-17 NOTE — Discharge Instructions (Signed)
°  Use a condom with every sexual encounter °Follow up with your doctor or the health department in regards to today's visit.   °Please return to the ER for fevers, vomiting, new or worsening symptoms, any additional concerns.  ° °You have been tested for HIV, syphilis, chlamydia and gonorrhea. These results will be available in approximately 3 days. You will be notified if they are positive.  ° °

## 2019-04-17 NOTE — ED Provider Notes (Signed)
[facility]ER[facility]ENC[facility] Provider Note   CSN: [id number] Arrival date & time: 04/17/19  1221    History   Chief Complaint Chief Complaint  Patient presents with  . Dysuria    HPI Laray U Eulah PontMurphy is a 28 y.o. male.     The history is provided by the patient and medical records. No language interpreter was used.  Dysuria  Presenting symptoms: dysuria   Presenting symptoms: no penile discharge and no penile pain   Associated symptoms: urinary frequency   Associated symptoms: no abdominal pain, no fever, no nausea, no penile swelling, no scrotal swelling and no vomiting    Arville U Eulah PontMurphy is a 28 y.o. male  with a PMH as listed below who presents to the Emergency Department complaining of discomfort with urination for the last several days.  Persistent, not worsening.  Denies any penile discharge.  No GU lesions.  No scrotal pain or swelling.  He is sexually active and has had new partner recently.  Denies any abdominal pain, flank pain or back pain.  Denies history of similar.  Past Medical History:  Diagnosis Date  . Asthma   . Heart murmur     Patient Active Problem List   Diagnosis Date Noted  . Low back pain 02/17/2015    History reviewed. No pertinent surgical history.      Home Medications    Prior to Admission medications   Medication Sig Start Date End Date Taking? Authorizing Provider  acetaminophen (TYLENOL) 325 MG tablet Take 2 tablets (650 mg total) by mouth every 6 (six) hours as needed. 02/01/19  Yes Derwood KaplanNanavati, Ankit, MD  albuterol (PROVENTIL HFA;VENTOLIN HFA) 108 (90 BASE) MCG/ACT inhaler Inhale 1-2 puffs into the lungs every 6 (six) hours as needed for wheezing or shortness of breath. Patient not taking: Reported on 02/01/2019 12/24/14   Tilden Fossaees, Elizabeth, MD  benzonatate (TESSALON) 100 MG capsule Take 1 capsule by mouth every 8 (eight) hours for cough. Patient not taking: Reported on 04/17/2019 03/30/19   Dahlia ByesBast, Traci A, NP   HYDROcodone-acetaminophen (NORCO) 5-325 MG tablet Take 1 tablet by mouth every 6 (six) hours as needed for moderate pain. Patient not taking: Reported on 02/01/2019 07/13/16   Lenda KelpHudnall, Shane R, MD  omeprazole (PRILOSEC) 20 MG capsule Take 1 capsule (20 mg total) by mouth daily. Patient not taking: Reported on 02/01/2019 12/24/14   Tilden Fossaees, Elizabeth, MD    Family History No family history on file.  Social History Social History   Tobacco Use  . Smoking status: Never Smoker  . Smokeless tobacco: Never Used  Substance Use Topics  . Alcohol use: Yes    Alcohol/week: 0.0 standard drinks    Comment: social  . Drug use: No     Allergies   Nsaids   Review of Systems Review of Systems  Constitutional: Negative for fever.  Gastrointestinal: Negative for abdominal pain, nausea and vomiting.  Genitourinary: Positive for dysuria and frequency. Negative for discharge, penile pain, penile swelling, scrotal swelling and urgency.  Musculoskeletal: Negative for back pain.     Physical Exam Updated Vital Signs BP 134/75   Pulse 66   Temp 98.4 F (36.9 C) (Oral)   Resp 16   Wt 95 kg   SpO2 100%   BMI 27.63 kg/m   Physical Exam Vitals signs and nursing note reviewed.  Constitutional:      General: He is not in acute distress.    Appearance: He is well-developed.  HENT:  Head: Normocephalic and atraumatic.  Neck:     Musculoskeletal: Neck supple.  Cardiovascular:     Rate and Rhythm: Normal rate and regular rhythm.     Heart sounds: Normal heart sounds. No murmur.  Pulmonary:     Effort: Pulmonary effort is normal. No respiratory distress.     Breath sounds: Normal breath sounds.  Abdominal:     General: There is no distension.     Palpations: Abdomen is soft.     Comments: No abdominal, flank or CVA tenderness  Genitourinary:    Comments: No discharge from penis. No signs of lesion or erythema on the penis or testicles. The penis and testicles are nontender. No testicular  masses or swelling. Cremaster reflex present bilaterally. Skin:    General: Skin is warm and dry.  Neurological:     Mental Status: He is alert and oriented to person, place, and time.      ED Treatments / Results  Labs (all labs ordered are listed, but only abnormal results are displayed) Labs Reviewed  URINALYSIS, ROUTINE W REFLEX MICROSCOPIC - Abnormal; Notable for the following components:      Result Value   Color, Urine AMBER (*)    APPearance HAZY (*)    Specific Gravity, Urine 1.034 (*)    Ketones, ur 5 (*)    Protein, ur 30 (*)    Leukocytes,Ua MODERATE (*)    Bacteria, UA RARE (*)    All other components within normal limits  RPR  HIV ANTIBODY (ROUTINE TESTING W REFLEX)  GC/CHLAMYDIA PROBE AMP ([place]) NOT AT [facility]    EKG None  Radiology No results found.  Procedures Procedures (including critical care time)  Medications Ordered in ED Medications  cefTRIAXone (ROCEPHIN) injection 250 mg (250 mg Intramuscular Given 04/17/19 1442)  azithromycin (ZITHROMAX) tablet 1,000 mg (1,000 mg Oral Given 04/17/19 1442)  sterile water (preservative free) injection (10 mLs  Given 04/17/19 1442)     Initial Impression / Assessment and Plan / ED Course  I have reviewed the triage vital signs and the nursing notes.  Pertinent labs & imaging results that were available during my care of the patient were reviewed by me and considered in my medical decision making (see chart for details).       Inmer U Andolina is a 28 y.o. male who presents to ED for dysuria.  Afebrile and hemodynamically stable.  No abdominal tenderness.  Normal GU exam.  He is sexually active with new partner recently.  HIV and RPR drawn.  Gonorrhea and Chlamydia testing sent.  Patient aware that he will be notified if results are positive.  Urinalysis shows moderate leuks and 21-50 WBCs.  Suspect urethritis.  Given Rocephin and Zithromax in ED.  Follow-up with health department encouraged.  Reasons to  return to ED discussed and all.  Final Clinical Impressions(s) / ED Diagnoses   Final diagnoses:  Leukocytes in urine    ED Discharge Orders    None       Nora Sabey, Chase Picket, PA-C 04/17/19 1458    Benjiman Core, MD 04/17/19 [phone]

## 2019-04-18 LAB — HIV ANTIBODY (ROUTINE TESTING W REFLEX): HIV Screen 4th Generation wRfx: NONREACTIVE

## 2019-04-18 LAB — RPR: RPR Ser Ql: NONREACTIVE

## 2019-04-20 LAB — GC/CHLAMYDIA PROBE AMP (~~LOC~~) NOT AT ARMC
Chlamydia: NEGATIVE
Neisseria Gonorrhea: NEGATIVE

## 2019-05-21 ENCOUNTER — Encounter (HOSPITAL_COMMUNITY): Payer: Self-pay | Admitting: Emergency Medicine

## 2019-05-21 ENCOUNTER — Other Ambulatory Visit: Payer: Self-pay

## 2019-05-21 ENCOUNTER — Ambulatory Visit (HOSPITAL_COMMUNITY)
Admission: EM | Admit: 2019-05-21 | Discharge: 2019-05-21 | Disposition: A | Discharge status: Home / Self Care | Payer: Self-pay | Attending: Emergency Medicine | Admitting: Emergency Medicine

## 2019-05-21 DIAGNOSIS — J020 Streptococcal pharyngitis: Secondary | ICD-10-CM

## 2019-05-21 LAB — POCT RAPID STREP A: Streptococcus, Group A Screen (Direct): POSITIVE — AB

## 2019-05-21 MED ORDER — PENICILLIN V POTASSIUM 500 MG PO TABS: 500.0000 mg | ORAL_TABLET | Freq: Two times a day (BID) | ORAL | 0 refills | Status: AC

## 2019-05-21 NOTE — Discharge Instructions (Addendum)
Take antibiotic as prescribed. Return for further evaluation if you develop fever, worsening sore throat, inability to swallow, difficulty breathing, choking, chest pain.

## 2019-05-21 NOTE — ED Provider Notes (Signed)
[facility]    CSN: [id number] Arrival date & time: 05/21/19  1227     History   Chief Complaint Chief Complaint  Patient presents with  . Sore Throat    HPI Carl [name] is a 28 y.o. male history of heart murmur presenting for 2-day course of sore throat.  Patient states his throat is worse on his left side.  Has had pain with swallowing, decreased appetite, and feels tender when he touches the left side of his neck..  Patient denies fever, chills, myalgias, eye/nose/ear pain, difficulty breathing, choking, cough, chest pain.  Patient has not tried anything for this.  Patient denies history of tonsillar abscess.  No known sick contacts.    Past Medical History:  Diagnosis Date  . Asthma   . Heart murmur     Patient Active Problem List   Diagnosis Date Noted  . Low back pain 02/17/2015    History reviewed. No pertinent surgical history.     Home Medications    Prior to Admission medications   Medication Sig Start Date End Date Taking? Authorizing Provider  acetaminophen (TYLENOL) 325 MG tablet Take 2 tablets (650 mg total) by mouth every 6 (six) hours as needed. 02/01/19   Derwood KaplanNanavati, Ankit, MD  albuterol (PROVENTIL HFA;VENTOLIN HFA) 108 (90 BASE) MCG/ACT inhaler Inhale 1-2 puffs into the lungs every 6 (six) hours as needed for wheezing or shortness of breath. Patient not taking: Reported on 02/01/2019 12/24/14   Tilden Fossaees, Elizabeth, MD  benzonatate (TESSALON) 100 MG capsule Take 1 capsule by mouth every 8 (eight) hours for cough. Patient not taking: Reported on 04/17/2019 03/30/19   Dahlia ByesBast, Traci A, NP  HYDROcodone-acetaminophen (NORCO) 5-325 MG tablet Take 1 tablet by mouth every 6 (six) hours as needed for moderate pain. Patient not taking: Reported on 02/01/2019 07/13/16   Lenda KelpHudnall, Shane R, MD  omeprazole (PRILOSEC) 20 MG capsule Take 1 capsule (20 mg total) by mouth daily. Patient not taking: Reported on 02/01/2019 12/24/14   Tilden Fossaees, Elizabeth, MD  penicillin v  potassium (VEETID) 500 MG tablet Take 1 tablet (500 mg total) by mouth 2 (two) times a day for 10 days. 05/21/19 05/31/19  Hall-Potvin, [place], PA-C    Family History History reviewed. No pertinent family history.  Social History Social History   Tobacco Use  . Smoking status: Never Smoker  . Smokeless tobacco: Never Used  Substance Use Topics  . Alcohol use: Yes    Alcohol/week: 0.0 standard drinks    Comment: social  . Drug use: No     Allergies   Nsaids   Review of Systems As per HPI   Physical Exam Triage Vital Signs ED Triage Vitals  Enc Vitals Group     BP 05/21/19 1324 (!) 141/55     Pulse Rate 05/21/19 1324 66     Resp 05/21/19 1324 20     Temp 05/21/19 1324 98.7 F (37.1 C)     Temp Source 05/21/19 1324 Temporal     SpO2 05/21/19 1324 98 %     Weight --      Height --      Head Circumference --      Peak Flow --      Pain Score 05/21/19 1320 10     Pain Loc --      Pain Edu? --      Excl. in [place]? --    No data found.  Updated Vital Signs BP (!) 141/55 (BP Location: Right  Arm)   Pulse 66   Temp 98.7 F (37.1 C) (Temporal)   Resp 20   SpO2 98%   Visual Acuity Right Eye Distance:   Left Eye Distance:   Bilateral Distance:    Right Eye Near:   Left Eye Near:    Bilateral Near:     Physical Exam Constitutional:      General: He is not in acute distress.    Appearance: He is not toxic-appearing.  HENT:     Head: Normocephalic and atraumatic.     Right Ear: Tympanic membrane and ear canal normal.     Left Ear: Tympanic membrane and ear canal normal.     Mouth/Throat:     Mouth: Mucous membranes are moist.     Pharynx: Oropharynx is clear. Uvula midline. Posterior oropharyngeal erythema present. No oropharyngeal exudate or uvula swelling.     Tonsils: Tonsillar exudate present. No tonsillar abscesses. 2+ on the right. 3+ on the left.  Eyes:     General: No scleral icterus.    Conjunctiva/sclera: Conjunctivae normal.     Pupils: Pupils  are equal, round, and reactive to light.  Neck:     Musculoskeletal: Normal range of motion and neck supple.     Comments: Tender left-sided cervical lymphadenopathy. Cardiovascular:     Rate and Rhythm: Normal rate.  Pulmonary:     Effort: Pulmonary effort is normal.  Lymphadenopathy:     Cervical: Cervical adenopathy present.  Skin:    Coloration: Skin is not jaundiced or pale.  Neurological:     Mental Status: He is alert and oriented to person, place, and time.      UC Treatments / Results  Labs (all labs ordered are listed, but only abnormal results are displayed) Labs Reviewed  POCT RAPID STREP A - Abnormal; Notable for the following components:      Result Value   Streptococcus, Group A Screen (Direct) POSITIVE (*)    All other components within normal limits    EKG   Radiology No results found.  Procedures Procedures (including critical care time)  Medications Ordered in UC Medications - No data to display  Initial Impression / Assessment and Plan / UC Course  I have reviewed the triage vital signs and the nursing notes.  Pertinent labs & imaging results that were available during my care of the patient were reviewed by me and considered in my medical decision making (see chart for details).     28 year old male presenting for 2-day course of sore throat.  Rapid strep in office positive.  Patient stable, not in acute distress: We will discharge to home with penicillin.  Return precautions discussed, patient verbalized understanding and is agreeable to plan. Final Clinical Impressions(s) / UC Diagnoses   Final diagnoses:  Strep pharyngitis     Discharge Instructions     Take antibiotic as prescribed. Return for further evaluation if you develop fever, worsening sore throat, inability to swallow, difficulty breathing, choking, chest pain.    ED Prescriptions    Medication Sig Dispense Auth. Provider   penicillin v potassium (VEETID) 500 MG tablet  Take 1 tablet (500 mg total) by mouth 2 (two) times a day for 10 days. 20 tablet Hall-Potvin, [place], PA-C     Controlled Substance Prescriptions [place] Controlled Substance Registry consulted? Not Applicable   Quincy Sheehan, [place] 05/21/19 1354

## 2019-05-21 NOTE — ED Triage Notes (Signed)
Sore throat started 2 days ago.  Unknown if running a fever.  Complains of headache and body weakness, chills

## 2019-08-18 ENCOUNTER — Ambulatory Visit (HOSPITAL_COMMUNITY)
Admission: EM | Admit: 2019-08-18 | Discharge: 2019-08-18 | Disposition: A | Discharge status: Home / Self Care | Payer: Self-pay | Attending: Emergency Medicine | Admitting: Emergency Medicine

## 2019-08-18 ENCOUNTER — Encounter (HOSPITAL_COMMUNITY): Payer: Self-pay

## 2019-08-18 ENCOUNTER — Other Ambulatory Visit: Payer: Self-pay

## 2019-08-18 DIAGNOSIS — Z202 Contact with and (suspected) exposure to infections with a predominantly sexual mode of transmission: Secondary | ICD-10-CM

## 2019-08-18 DIAGNOSIS — Z113 Encounter for screening for infections with a predominantly sexual mode of transmission: Secondary | ICD-10-CM

## 2019-08-18 MED ORDER — METRONIDAZOLE 500 MG PO TABS: 500.0000 mg | ORAL_TABLET | Freq: Two times a day (BID) | ORAL | 0 refills | Status: DC
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
## 2019-08-18 NOTE — ED Triage Notes (Signed)
Pt presents to UC stating some who he had sexual intercourse w/ informed him that she had tested positive for trichomonas. He would like to be tested.

## 2019-08-18 NOTE — Discharge Instructions (Signed)
Take the metronidazole twice a day for 7 days.   ° °Do not have sex for 7 days. ° °Your STD tests are pending.  If your test results are positive, we will call you.  You may need additional treatment and your partner may also need treatment.     ° ° ° °

## 2019-08-19 LAB — CYTOLOGY, (ORAL, ANAL, URETHRAL) ANCILLARY ONLY
Chlamydia: NEGATIVE
Neisseria Gonorrhea: NEGATIVE
Trichomonas: NEGATIVE

## 2019-10-02 ENCOUNTER — Ambulatory Visit (HOSPITAL_COMMUNITY)
Admission: EM | Admit: 2019-10-02 | Discharge: 2019-10-02 | Disposition: A | Discharge status: Home / Self Care | Payer: Self-pay | Attending: Emergency Medicine | Admitting: Emergency Medicine

## 2019-10-02 ENCOUNTER — Other Ambulatory Visit: Payer: Self-pay

## 2019-10-02 ENCOUNTER — Encounter (HOSPITAL_COMMUNITY): Payer: Self-pay

## 2019-10-02 DIAGNOSIS — J45909 Unspecified asthma, uncomplicated: Secondary | ICD-10-CM | POA: Insufficient documentation

## 2019-10-02 DIAGNOSIS — Z113 Encounter for screening for infections with a predominantly sexual mode of transmission: Secondary | ICD-10-CM | POA: Insufficient documentation

## 2019-10-02 DIAGNOSIS — Z20828 Contact with and (suspected) exposure to other viral communicable diseases: Secondary | ICD-10-CM | POA: Insufficient documentation

## 2019-10-02 DIAGNOSIS — Z7251 High risk heterosexual behavior: Secondary | ICD-10-CM | POA: Insufficient documentation

## 2019-10-02 DIAGNOSIS — J029 Acute pharyngitis, unspecified: Secondary | ICD-10-CM | POA: Insufficient documentation

## 2019-10-02 DIAGNOSIS — R011 Cardiac murmur, unspecified: Secondary | ICD-10-CM | POA: Insufficient documentation

## 2019-10-02 DIAGNOSIS — Z79899 Other long term (current) drug therapy: Secondary | ICD-10-CM | POA: Insufficient documentation

## 2019-10-02 LAB — HIV ANTIBODY (ROUTINE TESTING W REFLEX): HIV Screen 4th Generation wRfx: NONREACTIVE

## 2019-10-02 MED ORDER — CETIRIZINE HCL 10 MG PO TABS: 10.0000 mg | ORAL_TABLET | Freq: Every day | ORAL | 0 refills | Status: DC

## 2019-10-02 NOTE — ED Triage Notes (Signed)
Pt presents to UC w/ c/o sore throat x3 days. Pt states it is only left side.   Pt states he would like to be checked for STD. Pt denies any symptoms at this time.

## 2019-10-02 NOTE — Discharge Instructions (Addendum)
Throat lozenges, gargles, chloraseptic spray, warm teas, popsicles etc to help with throat pain.   Daily zyrtec.  Please use condoms with sexual activity to prevent stds.  Will notify of any positive findings and if any changes to treatment are needed.   Self isolate until covid results are back and negative.  Will notify you by phone of any positive findings. Your negative results will be sent through your MyChart.

## 2019-10-02 NOTE — ED Provider Notes (Signed)
[facility]    CSN: [id number] Arrival date & time: 10/02/19  1407      History   Chief Complaint Chief Complaint  Patient presents with  . sore throat, std screen    HPI Carl [name] is a 28 y.o. male.   Carl [name] presents with complaints of mild sore throat, worse to left of throat. States it is mild, "not like strep."  Started three days ago. Has been taking benadryl which hasn't helped. No fevers. No chills. Rare cough. No ear pain. No gi symptoms. Works two part time jobs around others and [employer]. No specific known ill contacts. Also requesting std screen. He has had multiple partners, unable to even count, >10 in the past month. Male partners. Denies any penile discharge, no pain with urination, no pelvic pain. No specific known STD exposures.    ROS per HPI, negative if not otherwise mentioned.      Past Medical History:  Diagnosis Date  . Asthma   . Heart murmur     Patient Active Problem List   Diagnosis Date Noted  . Low back pain 02/17/2015    History reviewed. No pertinent surgical history.     Home Medications    Prior to Admission medications   Medication Sig Start Date End Date Taking? Authorizing Provider  acetaminophen (TYLENOL) 325 MG tablet Take 2 tablets (650 mg total) by mouth every 6 (six) hours as needed. 02/01/19   Derwood Kaplan, MD  albuterol (PROVENTIL HFA;VENTOLIN HFA) 108 (90 BASE) MCG/ACT inhaler Inhale 1-2 puffs into the lungs every 6 (six) hours as needed for wheezing or shortness of breath. Patient not taking: Reported on 02/01/2019 12/24/14   Tilden Fossa, MD  benzonatate (TESSALON) 100 MG capsule Take 1 capsule by mouth every 8 (eight) hours for cough. Patient not taking: Reported on 04/17/2019 03/30/19   Dahlia Byes A, NP  cetirizine (ZYRTEC) 10 MG tablet Take 1 tablet (10 mg total) by mouth daily. 10/02/19   Georgetta Haber, NP  HYDROcodone-acetaminophen (NORCO) 5-325 MG tablet Take 1 tablet by  mouth every 6 (six) hours as needed for moderate pain. Patient not taking: Reported on 02/01/2019 07/13/16   Lenda Kelp, MD  metroNIDAZOLE (FLAGYL) 500 MG tablet Take 1 tablet (500 mg total) by mouth 2 (two) times daily. 08/18/19   Mickie Bail, NP  omeprazole (PRILOSEC) 20 MG capsule Take 1 capsule (20 mg total) by mouth daily. Patient not taking: Reported on 02/01/2019 12/24/14   Tilden Fossa, MD    Family History Family History  Problem Relation Age of Onset  . Healthy Mother   . Healthy Father     Social History Social History   Tobacco Use  . Smoking status: Never Smoker  . Smokeless tobacco: Never Used  Substance Use Topics  . Alcohol use: Yes    Alcohol/week: 0.0 standard drinks    Comment: social  . Drug use: No     Allergies   Nsaids   Review of Systems Review of Systems   Physical Exam Triage Vital Signs ED Triage Vitals  Enc Vitals Group     BP 10/02/19 1446 (!) 147/93     Pulse Rate 10/02/19 1446 (!) 58     Resp 10/02/19 1446 16     Temp 10/02/19 1446 98.1 F (36.7 C)     Temp Source 10/02/19 1446 Oral     SpO2 10/02/19 1446 100 %     Weight --  Height --      Head Circumference --      Peak Flow --      Pain Score 10/02/19 1450 5     Pain Loc --      Pain Edu? --      Excl. in [place]? --    No data found.  Updated Vital Signs BP (!) 147/93 (BP Location: Right Arm)   Pulse (!) 58   Temp 98.1 F (36.7 C) (Oral)   Resp 16   SpO2 100%    Physical Exam Constitutional:      Appearance: He is well-developed.  HENT:     Head: Normocephalic and atraumatic.     Mouth/Throat:     Mouth: Mucous membranes are moist.     Pharynx: Oropharynx is clear.     Tonsils: No tonsillar exudate. 1+ on the right. 1+ on the left.  Cardiovascular:     Rate and Rhythm: Normal rate.  Pulmonary:     Effort: Pulmonary effort is normal.  Abdominal:     Palpations: Abdomen is soft. Abdomen is not rigid.     Tenderness: There is no abdominal tenderness.  There is no guarding or rebound. Negative signs include Koenen's sign and McBurney's sign.     Comments: Denies scrotal redness, swelling, pain; denies sores or lesions; gu exam deferred   Skin:    General: Skin is warm and dry.  Neurological:     Mental Status: He is alert and oriented to person, place, and time.      UC Treatments / Results  Labs (all labs ordered are listed, but only abnormal results are displayed) Labs Reviewed  SARS CORONAVIRUS 2 (TAT 6-24 HRS)  HIV ANTIBODY (ROUTINE TESTING W REFLEX)  RPR  CYTOLOGY, (ORAL, ANAL, URETHRAL) ANCILLARY ONLY    EKG   Radiology No results found.  Procedures Procedures (including critical care time)  Medications Ordered in UC Medications - No data to display  Initial Impression / Assessment and Plan / UC Course  I have reviewed the triage vital signs and the nursing notes.  Pertinent labs & imaging results that were available during my care of the patient were reviewed by me and considered in my medical decision making (see chart for details).     Non toxic. Benign physical exam.  Likely viral vs allergic pharyngitis. Supportive cares recommended. covid testing collected. Std screening pending. Encouraged safe sex. Return precautions provided. Patient verbalized understanding and agreeable to plan.   Final Clinical Impressions(s) / UC Diagnoses   Final diagnoses:  Acute pharyngitis, unspecified etiology  Screen for STD (sexually transmitted disease)  High risk heterosexual behavior     Discharge Instructions     Throat lozenges, gargles, chloraseptic spray, warm teas, popsicles etc to help with throat pain.   Daily zyrtec.  Please use condoms with sexual activity to prevent stds.  Will notify of any positive findings and if any changes to treatment are needed.   Self isolate until covid results are back and negative.  Will notify you by phone of any positive findings. Your negative results will be sent through  your MyChart.        ED Prescriptions    Medication Sig Dispense Auth. Provider   cetirizine (ZYRTEC) 10 MG tablet Take 1 tablet (10 mg total) by mouth daily. 30 tablet Zigmund Gottron, NP     PDMP not reviewed this encounter.   Zigmund Gottron, NP 10/02/19 1546

## 2019-10-03 LAB — RPR: RPR Ser Ql: NONREACTIVE

## 2019-10-03 LAB — NOVEL CORONAVIRUS, NAA (HOSP ORDER, SEND-OUT TO REF LAB; TAT 18-24 HRS): SARS-CoV-2, NAA: NOT DETECTED

## 2019-10-06 LAB — CYTOLOGY, (ORAL, ANAL, URETHRAL) ANCILLARY ONLY
Chlamydia: NEGATIVE
Neisseria Gonorrhea: NEGATIVE
Trichomonas: NEGATIVE

## 2019-11-11 ENCOUNTER — Encounter (HOSPITAL_COMMUNITY): Payer: Self-pay

## 2019-11-11 ENCOUNTER — Ambulatory Visit (HOSPITAL_COMMUNITY)
Admission: EM | Admit: 2019-11-11 | Discharge: 2019-11-11 | Disposition: A | Discharge status: Home / Self Care | Payer: HRSA Program | Attending: Family Medicine | Admitting: Family Medicine

## 2019-11-11 ENCOUNTER — Other Ambulatory Visit: Payer: Self-pay

## 2019-11-11 DIAGNOSIS — Z113 Encounter for screening for infections with a predominantly sexual mode of transmission: Secondary | ICD-10-CM | POA: Diagnosis not present

## 2019-11-11 DIAGNOSIS — Z20822 Contact with and (suspected) exposure to covid-19: Secondary | ICD-10-CM

## 2019-11-11 DIAGNOSIS — Z20828 Contact with and (suspected) exposure to other viral communicable diseases: Secondary | ICD-10-CM | POA: Diagnosis not present

## 2019-11-11 LAB — HIV ANTIBODY (ROUTINE TESTING W REFLEX): HIV Screen 4th Generation wRfx: NONREACTIVE

## 2019-11-11 NOTE — ED Provider Notes (Signed)
[facility]   [id number] 11/11/19 Arrival Time: 1546  ASSESSMENT & PLAN:  1. Exposure to COVID-19 virus   2. Screening for STDs (sexually transmitted diseases)      COVID-19 testing sent. See work note for self-isolation guidelines.  Urethral cytology and HIV/RPR pending. Will notify of any positive results.    Discharge Instructions     We have sent testing for sexually transmitted infections. We will notify you of any positive results once they are received. If required, we will prescribe any medications you might need.  Please refrain from all sexual activity for at least the next seven days.  If your Covid-19 test is positive, you will receive a phone call from [facility] regarding your results. Negative test results are not called. Both positive and negative results area always visible on MyChart. If you do not have a MyChart account, sign up instructions are in your discharge papers.      Follow-up Information    [facility].   Specialty: Urgent Care Why: As needed. Contact information: [address] [place] [address] [phone]          Reviewed expectations re: course of current medical issues. Questions answered. Outlined signs and symptoms indicating need for more acute intervention. Patient verbalized understanding. After Visit Summary given.   SUBJECTIVE: History from: patient. Carl [name] is a 28 y.o. male who requests COVID-19 testing. Known COVID-19 contact: co-worker who recently tested positive. Recent travel: none. Denies: runny nose, congestion, fever, cough, difficulty breathing and headache. Occasional ST "but not bad". Normal PO intake without n/v/d.  Also requests STD screening. No symptoms. Sexually active with male partner.  ROS: As per HPI.   OBJECTIVE:  Vitals:   11/11/19 1659  BP: (!) 127/56  Pulse: 61  Resp: 18  Temp: 97.8 F (36.6 C)    TempSrc: Oral  SpO2: 99%    General appearance: alert; no distress Eyes: PERRLA; EOMI; conjunctiva normal HENT: L[place]; AT; nasal mucosa normal; oral mucosa normal Neck: supple  Lungs: speaks full sentences without difficulty; unlabored Heart: regular rate and rhythm Abdomen: soft, non-tender Extremities: no edema Skin: warm and dry Neurologic: normal gait Psychological: alert and cooperative; normal mood and affect  Labs:  Labs Reviewed  NOVEL CORONAVIRUS, NAA (HOSP ORDER, SEND-OUT TO REF LAB; TAT 18-24 HRS)  RPR  HIV ANTIBODY (ROUTINE TESTING W REFLEX)  CYTOLOGY, (ORAL, ANAL, URETHRAL) ANCILLARY ONLY     Allergies  Allergen Reactions  . Nsaids Nausea And Vomiting    Can't take aleve but can take ibuprofen     Past Medical History:  Diagnosis Date  . Asthma   . Heart murmur    Social History   Socioeconomic History  . Marital status: Single    Spouse name: Not on file  . Number of children: Not on file  . Years of education: Not on file  . Highest education level: Not on file  Occupational History  . Not on file  Tobacco Use  . Smoking status: Never Smoker  . Smokeless tobacco: Never Used  Substance and Sexual Activity  . Alcohol use: Yes    Alcohol/week: 0.0 standard drinks    Comment: social  . Drug use: No  . Sexual activity: Yes  Other Topics Concern  . Not on file  Social History Narrative  . Not on file   Social Determinants of Health   Financial Resource Strain:   . Difficulty of Paying  Living Expenses: Not on file  Food Insecurity:   . Worried About Charity fundraiser in the Last Year: Not on file  . Ran Out of Food in the Last Year: Not on file  Transportation Needs:   . Lack of Transportation (Medical): Not on file  . Lack of Transportation (Non-Medical): Not on file  Physical Activity:   . Days of Exercise per Week: Not on file  . Minutes of Exercise per Session: Not on file  Stress:   . Feeling of Stress : Not on file  Social  Connections:   . Frequency of Communication with Friends and Family: Not on file  . Frequency of Social Gatherings with Friends and Family: Not on file  . Attends Religious Services: Not on file  . Active Member of Clubs or Organizations: Not on file  . Attends Archivist Meetings: Not on file  . Marital Status: Not on file  Intimate Partner Violence:   . Fear of Current or Ex-Partner: Not on file  . Emotionally Abused: Not on file  . Physically Abused: Not on file  . Sexually Abused: Not on file   Family History  Problem Relation Age of Onset  . Healthy Mother   . Healthy Father    History reviewed. No pertinent surgical history.   Vanessa Kick, MD 11/11/19 [phone]

## 2019-11-11 NOTE — ED Triage Notes (Signed)
Patient presents to Urgent Care with complaints of sore throat since x several weeks. Denies HA, congestion, chills, or n/v/d or generalized malaise; states some nasal drainage. Appetite intact. States possible exposure with covid co-worker. Patient requests "check up for STD", denies penile drainage or dysuria s/s. Not aware of any contact with partner with STI.

## 2019-11-11 NOTE — Discharge Instructions (Signed)
We have sent testing for sexually transmitted infections. We will notify you of any positive results once they are received. If required, we will prescribe any medications you might need.  Please refrain from all sexual activity for at least the next seven days.  If your Covid-19 test is positive, you will receive a phone call from [facility] regarding your results. Negative test results are not called. Both positive and negative results area always visible on MyChart. If you do not have a MyChart account, sign up instructions are in your discharge papers.

## 2019-11-12 LAB — NOVEL CORONAVIRUS, NAA (HOSP ORDER, SEND-OUT TO REF LAB; TAT 18-24 HRS): SARS-CoV-2, NAA: NOT DETECTED

## 2019-11-12 LAB — RPR: RPR Ser Ql: NONREACTIVE

## 2019-11-16 LAB — CYTOLOGY, (ORAL, ANAL, URETHRAL) ANCILLARY ONLY
Chlamydia: NEGATIVE
Neisseria Gonorrhea: NEGATIVE
Trichomonas: NEGATIVE

## 2019-11-25 ENCOUNTER — Ambulatory Visit (HOSPITAL_COMMUNITY)
Admission: EM | Admit: 2019-11-25 | Discharge: 2019-11-25 | Disposition: A | Discharge status: Home / Self Care | Payer: 59 | Attending: Family Medicine | Admitting: Family Medicine

## 2019-11-25 ENCOUNTER — Encounter (HOSPITAL_COMMUNITY): Payer: Self-pay

## 2019-11-25 ENCOUNTER — Other Ambulatory Visit: Payer: Self-pay

## 2019-11-25 DIAGNOSIS — H209 Unspecified iridocyclitis: Secondary | ICD-10-CM

## 2019-11-25 MED ORDER — FLUORESCEIN SODIUM 1 MG OP STRP
ORAL_STRIP | OPHTHALMIC | Status: AC
  Filled 2019-11-25: qty 1

## 2019-11-25 MED ORDER — NEOMYCIN-POLYMYXIN-DEXAMETH 3.5-10000-0.1 OP SUSP: 1.0000 [drp] | Freq: Four times a day (QID) | OPHTHALMIC | 0 refills | Status: DC

## 2019-11-25 MED ORDER — TETRACAINE HCL 0.5 % OP SOLN
OPHTHALMIC | Status: AC
  Filled 2019-11-25: qty 4
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
## 2019-11-25 NOTE — Discharge Instructions (Addendum)
Please use eye drops every 4-6 hours for the next 4-5 days Follow up with Dr. Sherryll Burger- please call office today to make appoitnment

## 2020-02-16 ENCOUNTER — Other Ambulatory Visit: Payer: Self-pay

## 2020-02-16 ENCOUNTER — Encounter (HOSPITAL_COMMUNITY): Payer: Self-pay

## 2020-02-16 ENCOUNTER — Ambulatory Visit (HOSPITAL_COMMUNITY)
Admission: EM | Admit: 2020-02-16 | Discharge: 2020-02-16 | Disposition: A | Discharge status: Home / Self Care | Payer: 59 | Attending: Family Medicine | Admitting: Family Medicine

## 2020-02-16 DIAGNOSIS — Z113 Encounter for screening for infections with a predominantly sexual mode of transmission: Secondary | ICD-10-CM | POA: Diagnosis not present

## 2020-02-16 NOTE — Discharge Instructions (Addendum)
You can check your MyChart for results we will call with any positive results.

## 2020-02-16 NOTE — ED Provider Notes (Signed)
[facility]    CSN: [id number] Arrival date & time: 02/16/20  1136      History   Chief Complaint Chief Complaint  Patient presents with  . STD testing    HPI Carl [name] is a 29 y.o. male.   Pt is a 29 year old male that presents for STD screening.  Patient has had multiple visits for same.  He currently denies any symptoms.     Past Medical History:  Diagnosis Date  . Asthma   . Heart murmur     Patient Active Problem List   Diagnosis Date Noted  . Low back pain 02/17/2015    History reviewed. No pertinent surgical history.     Home Medications    Prior to Admission medications   Medication Sig Start Date End Date Taking? Authorizing Provider  acetaminophen (TYLENOL) 325 MG tablet Take 2 tablets (650 mg total) by mouth every 6 (six) hours as needed. 02/01/19   Derwood Kaplan, MD  cetirizine (ZYRTEC) 10 MG tablet Take 1 tablet (10 mg total) by mouth daily. 10/02/19   Georgetta Haber, NP  metroNIDAZOLE (FLAGYL) 500 MG tablet Take 1 tablet (500 mg total) by mouth 2 (two) times daily. 08/18/19   Mickie Bail, NP  neomycin-polymyxin b-dexamethasone (MAXITROL) 3.5-10000-0.1 SUSP Place 1-2 drops into the right eye every 6 (six) hours. 11/25/19   Wieters, Hallie C, PA-C  albuterol (PROVENTIL HFA;VENTOLIN HFA) 108 (90 BASE) MCG/ACT inhaler Inhale 1-2 puffs into the lungs every 6 (six) hours as needed for wheezing or shortness of breath. Patient not taking: Reported on 02/01/2019 12/24/14 11/25/19  Tilden Fossa, MD  omeprazole (PRILOSEC) 20 MG capsule Take 1 capsule (20 mg total) by mouth daily. Patient not taking: Reported on 02/01/2019 12/24/14 11/25/19  Tilden Fossa, MD    Family History Family History  Problem Relation Age of Onset  . Healthy Mother   . Healthy Father     Social History Social History   Tobacco Use  . Smoking status: Never Smoker  . Smokeless tobacco: Never Used  Substance Use Topics  . Alcohol use: Yes   Alcohol/week: 0.0 standard drinks    Comment: social  . Drug use: No     Allergies   Nsaids   Review of Systems Review of Systems   Physical Exam Triage Vital Signs ED Triage Vitals  Enc Vitals Group     BP 02/16/20 1201 128/76     Pulse Rate 02/16/20 1201 70     Resp 02/16/20 1201 16     Temp 02/16/20 1201 97.9 F (36.6 C)     Temp Source 02/16/20 1201 Oral     SpO2 02/16/20 1201 97 %     Weight --      Height --      Head Circumference --      Peak Flow --      Pain Score 02/16/20 1200 0     Pain Loc --      Pain Edu? --      Excl. in GC? --    No data found.  Updated Vital Signs BP 128/76 (BP Location: Right Arm)   Pulse 70   Temp 97.9 F (36.6 C) (Oral)   Resp 16   SpO2 97%   Visual Acuity Right Eye Distance:   Left Eye Distance:   Bilateral Distance:    Right Eye Near:   Left Eye Near:    Bilateral Near:     Physical  Exam Vitals and nursing note reviewed.  Constitutional:      Appearance: Normal appearance.  HENT:     Head: Normocephalic and atraumatic.     Nose: Nose normal.  Eyes:     Conjunctiva/sclera: Conjunctivae normal.  Pulmonary:     Effort: Pulmonary effort is normal.  Musculoskeletal:        General: Normal range of motion.     Cervical back: Normal range of motion.  Skin:    General: Skin is warm and dry.  Neurological:     Mental Status: He is alert.  Psychiatric:        Mood and Affect: Mood normal.      UC Treatments / Results  Labs (all labs ordered are listed, but only abnormal results are displayed) Labs Reviewed  CYTOLOGY, (ORAL, ANAL, URETHRAL) ANCILLARY ONLY    EKG   Radiology No results found.  Procedures Procedures (including critical care time)  Medications Ordered in UC Medications - No data to display  Initial Impression / Assessment and Plan / UC Course  I have reviewed the triage vital signs and the nursing notes.  Pertinent labs & imaging results that were available during my care of  the patient were reviewed by me and considered in my medical decision making (see chart for details).     STD screening-swab sent for testing labs pending Final Clinical Impressions(s) / UC Diagnoses   Final diagnoses:  Screening examination for STD (sexually transmitted disease)     Discharge Instructions     You can check your MyChart for results we will call with any positive results.    ED Prescriptions    None     PDMP not reviewed this encounter.   Orvan July, NP 02/16/20 1237

## 2020-02-16 NOTE — ED Triage Notes (Signed)
Pt presents to UC for STD's testing. Pt denies any signs and symptoms.

## 2020-02-17 ENCOUNTER — Telehealth (HOSPITAL_COMMUNITY): Payer: Self-pay

## 2020-02-17 LAB — CYTOLOGY, (ORAL, ANAL, URETHRAL) ANCILLARY ONLY
Chlamydia: POSITIVE — AB
Comment: NEGATIVE
Comment: NEGATIVE
Comment: NORMAL
Neisseria Gonorrhea: NEGATIVE
Trichomonas: NEGATIVE

## 2020-02-17 MED ORDER — AZITHROMYCIN 250 MG PO TABS: 1000.0000 mg | ORAL_TABLET | Freq: Once | ORAL | 0 refills | Status: AC

## 2020-02-17 MED ORDER — AZITHROMYCIN 250 MG PO TABS: 1000.0000 mg | ORAL_TABLET | Freq: Once | ORAL | 0 refills | Status: DC

## 2020-02-17 NOTE — Telephone Encounter (Signed)
Azithromycin 1g resent to pharmacy.

## 2020-02-22 ENCOUNTER — Emergency Department (HOSPITAL_COMMUNITY): Payer: 59

## 2020-02-22 ENCOUNTER — Emergency Department (HOSPITAL_COMMUNITY)
Admission: EM | Admit: 2020-02-22 | Discharge: 2020-02-22 | Disposition: A | Discharge status: Home / Self Care | Payer: 59 | Attending: Emergency Medicine | Admitting: Emergency Medicine

## 2020-02-22 DIAGNOSIS — Y929 Unspecified place or not applicable: Secondary | ICD-10-CM | POA: Insufficient documentation

## 2020-02-22 DIAGNOSIS — Z79899 Other long term (current) drug therapy: Secondary | ICD-10-CM | POA: Diagnosis not present

## 2020-02-22 DIAGNOSIS — Y999 Unspecified external cause status: Secondary | ICD-10-CM | POA: Diagnosis not present

## 2020-02-22 DIAGNOSIS — M25522 Pain in left elbow: Secondary | ICD-10-CM | POA: Insufficient documentation

## 2020-02-22 DIAGNOSIS — Y939 Activity, unspecified: Secondary | ICD-10-CM | POA: Diagnosis not present

## 2020-02-22 DIAGNOSIS — S3992XA Unspecified injury of lower back, initial encounter: Secondary | ICD-10-CM | POA: Insufficient documentation

## 2020-02-22 DIAGNOSIS — M25521 Pain in right elbow: Secondary | ICD-10-CM | POA: Diagnosis not present

## 2020-02-22 DIAGNOSIS — T07XXXA Unspecified multiple injuries, initial encounter: Secondary | ICD-10-CM

## 2020-02-22 DIAGNOSIS — M545 Low back pain: Secondary | ICD-10-CM | POA: Diagnosis present

## 2020-02-22 MED ORDER — HYDROCODONE-ACETAMINOPHEN 5-325 MG PO TABS: 1.0000 | ORAL_TABLET | ORAL | 0 refills | Status: DC | PRN

## 2020-02-22 MED ORDER — HYDROCODONE-ACETAMINOPHEN 5-325 MG PO TABS
1.0000 | ORAL_TABLET | Freq: Once | ORAL | Status: AC
  Administered 2020-02-22: 1 via ORAL
  Filled 2020-02-22: qty 1

## 2020-02-22 NOTE — Discharge Instructions (Signed)
Use ice on the sore spots 3 or 4 times a day for 2 days after that use heat.  See the doctor of your choice if needed for problems.
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
## 2020-02-26 ENCOUNTER — Encounter (HOSPITAL_COMMUNITY): Payer: Self-pay

## 2020-02-26 ENCOUNTER — Other Ambulatory Visit: Payer: Self-pay

## 2020-02-26 ENCOUNTER — Ambulatory Visit (HOSPITAL_COMMUNITY)
Admission: EM | Admit: 2020-02-26 | Discharge: 2020-02-26 | Disposition: A | Discharge status: Home / Self Care | Payer: 59 | Attending: Family Medicine | Admitting: Family Medicine

## 2020-02-26 DIAGNOSIS — S39012A Strain of muscle, fascia and tendon of lower back, initial encounter: Secondary | ICD-10-CM

## 2020-02-26 MED ORDER — MELOXICAM 15 MG PO TABS: 15.0000 mg | ORAL_TABLET | Freq: Every day | ORAL | 1 refills | Status: DC

## 2020-02-26 MED ORDER — CYCLOBENZAPRINE HCL 10 MG PO TABS: 10.0000 mg | ORAL_TABLET | Freq: Two times a day (BID) | ORAL | 0 refills | Status: DC | PRN
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
## 2020-02-26 NOTE — Discharge Instructions (Signed)
[facility]  [address], [place], [place] [address] [phone]  Call to schedule an appointment for further evaluation of back pain.
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
## 2020-02-29 ENCOUNTER — Encounter (HOSPITAL_COMMUNITY): Payer: Self-pay

## 2020-02-29 ENCOUNTER — Ambulatory Visit (HOSPITAL_COMMUNITY)
Admission: EM | Admit: 2020-02-29 | Discharge: 2020-02-29 | Disposition: A | Discharge status: Home / Self Care | Payer: 59 | Attending: Family Medicine | Admitting: Family Medicine

## 2020-02-29 ENCOUNTER — Other Ambulatory Visit: Payer: Self-pay

## 2020-02-29 DIAGNOSIS — M545 Low back pain, unspecified: Secondary | ICD-10-CM

## 2020-02-29 MED ORDER — IBUPROFEN 600 MG PO TABS: 600.0000 mg | ORAL_TABLET | Freq: Four times a day (QID) | ORAL | 0 refills | Status: DC | PRN

## 2020-02-29 NOTE — ED Notes (Addendum)
Patient states he called [facility] and they refused to see him.
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
## 2020-02-29 NOTE — Discharge Instructions (Addendum)
Please try a brace  Please take ibuprofen on a regular basis  Please stop the mobic  Please take tylenol if the pain is ongoing with ibuprofen  Please try the stretches and exercises  Please follow up if your symptoms fail to improve.

## 2020-03-04 ENCOUNTER — Other Ambulatory Visit: Payer: Self-pay

## 2020-03-04 ENCOUNTER — Ambulatory Visit (INDEPENDENT_AMBULATORY_CARE_PROVIDER_SITE_OTHER): Payer: 59 | Admitting: Sports Medicine

## 2020-03-04 ENCOUNTER — Encounter: Payer: Self-pay | Admitting: Sports Medicine

## 2020-03-04 VITALS — BP 118/72 | Ht 72.0 in | Wt 185.0 lb

## 2020-03-04 DIAGNOSIS — M545 Low back pain, unspecified: Secondary | ICD-10-CM

## 2020-03-04 MED ORDER — METHYLPREDNISOLONE 4 MG PO TBPK: ORAL_TABLET | ORAL | 0 refills | Status: DC
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
## 2020-03-04 NOTE — Assessment & Plan Note (Addendum)
Likely due to muscle spasm and contusion of the lower back.  No red flag symptoms.  Patient will benefit from rehab of the lower back.  We will give him low back exercises that he can start while he waits for his appointment with formal physical therapy.  We are placing a referral to physical therapy today.  We will also prescribe a steroid Dosepak to help with his pain short-term.  A work note was provided, and he will plan to return to work on May 3.  If his pain has not significantly improved, he will plan to see [place] for follow-up on May 3.

## 2020-03-14 ENCOUNTER — Other Ambulatory Visit: Payer: Self-pay

## 2020-03-14 ENCOUNTER — Ambulatory Visit (INDEPENDENT_AMBULATORY_CARE_PROVIDER_SITE_OTHER): Payer: 59 | Admitting: Family Medicine

## 2020-03-14 VITALS — BP 110/60 | Ht 72.0 in | Wt 185.0 lb

## 2020-03-14 DIAGNOSIS — M545 Low back pain, unspecified: Secondary | ICD-10-CM

## 2020-03-14 NOTE — Patient Instructions (Signed)
You have a lumbar strain. Try topical capsaicin up to 4 times a day. Heat (not right after the capsaicin though) 15 minutes at a time 3-4 times a day. Continue with physical therapy. Let the therapist know about the flare on Friday - you'll likely need to back down some on the rehab and work your way up. 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

## 2020-03-15 ENCOUNTER — Encounter: Payer: Self-pay | Admitting: Family Medicine
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
## 2020-03-28 ENCOUNTER — Encounter: Payer: Self-pay | Admitting: Family Medicine

## 2020-03-28 ENCOUNTER — Ambulatory Visit (INDEPENDENT_AMBULATORY_CARE_PROVIDER_SITE_OTHER): Payer: 59 | Admitting: Family Medicine

## 2020-03-28 ENCOUNTER — Other Ambulatory Visit: Payer: Self-pay

## 2020-03-28 VITALS — BP 125/46 | Ht 72.0 in | Wt 185.0 lb

## 2020-03-28 DIAGNOSIS — M545 Low back pain, unspecified: Secondary | ICD-10-CM
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
## 2020-03-28 NOTE — Patient Instructions (Signed)
Continue your therapy with transition to home exercise program when you feel comfortable. Light duty for 2 weeks then return to full duty. Call me if you have any problems otherwise follow up as needed.

## 2020-04-13 ENCOUNTER — Ambulatory Visit: Payer: 59 | Admitting: Family Medicine

## 2020-04-13 ENCOUNTER — Encounter (HOSPITAL_COMMUNITY): Payer: Self-pay

## 2020-04-13 ENCOUNTER — Ambulatory Visit (HOSPITAL_COMMUNITY)
Admission: EM | Admit: 2020-04-13 | Discharge: 2020-04-13 | Disposition: A | Discharge status: Home / Self Care | Payer: 59 | Attending: Emergency Medicine | Admitting: Emergency Medicine

## 2020-04-13 ENCOUNTER — Other Ambulatory Visit: Payer: Self-pay

## 2020-04-13 DIAGNOSIS — M545 Low back pain, unspecified: Secondary | ICD-10-CM

## 2020-04-13 DIAGNOSIS — Z113 Encounter for screening for infections with a predominantly sexual mode of transmission: Secondary | ICD-10-CM | POA: Insufficient documentation

## 2020-04-13 MED ORDER — CELECOXIB 100 MG PO CAPS: 100.0000 mg | ORAL_CAPSULE | Freq: Two times a day (BID) | ORAL | 0 refills | Status: DC

## 2020-04-13 MED ORDER — CYCLOBENZAPRINE HCL 5 MG PO TABS: 5.0000 mg | ORAL_TABLET | Freq: Two times a day (BID) | ORAL | 0 refills | Status: DC | PRN

## 2020-04-13 NOTE — Discharge Instructions (Signed)
Celebrex twice daily with food as needed for pain You may use flexeril as needed to help with pain. This is a muscle relaxer and causes sedation- please use only at bedtime or when you will be home and not have to drive/work Gentle stretching- see attached Follow up with sports medicine as planned
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
## 2020-04-13 NOTE — ED Provider Notes (Signed)
[facility]    CSN: [id number] Arrival date & time: 04/13/20  1616      History   Chief Complaint Chief Complaint  Patient presents with  . Back Pain    HPI Carl [name] is a 29 y.o. male history of asthma presenting today for evaluation of back pain.  Patient has had persistent issues with right lower back for a couple of months which she has recently been seeing sports medicine and going to physical therapy for.  He recently was having improvement in symptoms, but switched back to 12-hour shifts where he is doing prolonged standing twisting and bending motions with occasional heavy lifting.  Symptoms flared up in the past 24 hours and reports an increased pain and burning sensation in his right lower back.  Has not used medicines for symptoms.  Does feel muscle relaxers helped the most.  Has been unable to tolerate ibuprofen.  Tylenol no longer helping.  Attended physical therapy this morning and does feel slightly improved from how he felt first thing this morning.  Also requesting STD testing.  Denies symptoms or specific concerns/exposure.  HPI  Past Medical History:  Diagnosis Date  . Asthma   . Heart murmur     Patient Active Problem List   Diagnosis Date Noted  . Low back pain 02/17/2015    History reviewed. No pertinent surgical history.     Home Medications    Prior to Admission medications   Medication Sig Start Date End Date Taking? Authorizing Provider  celecoxib (CELEBREX) 100 MG capsule Take 1 capsule (100 mg total) by mouth 2 (two) times daily. With food 04/13/20   Kitti Mcclish C, PA-C  cyclobenzaprine (FLEXERIL) 5 MG tablet Take 1-2 tablets (5-10 mg total) by mouth 2 (two) times daily as needed for muscle spasms. 04/13/20   Sophya Vanblarcom C, PA-C  albuterol (PROVENTIL HFA;VENTOLIN HFA) 108 (90 BASE) MCG/ACT inhaler Inhale 1-2 puffs into the lungs every 6 (six) hours as needed for wheezing or shortness of breath. Patient not taking:  Reported on 02/01/2019 12/24/14 11/25/19  Tilden Fossa, MD  cetirizine (ZYRTEC) 10 MG tablet Take 1 tablet (10 mg total) by mouth daily. 10/02/19 04/13/20  Georgetta Haber, NP  omeprazole (PRILOSEC) 20 MG capsule Take 1 capsule (20 mg total) by mouth daily. Patient not taking: Reported on 02/01/2019 12/24/14 11/25/19  Tilden Fossa, MD    Family History Family History  Problem Relation Age of Onset  . Healthy Mother   . Healthy Father     Social History Social History   Tobacco Use  . Smoking status: Never Smoker  . Smokeless tobacco: Never Used  Substance Use Topics  . Alcohol use: Yes    Alcohol/week: 0.0 standard drinks    Comment: social  . Drug use: No     Allergies   Nsaids   Review of Systems Review of Systems  Constitutional: Negative for fatigue and fever.  Eyes: Negative for redness, itching and visual disturbance.  Respiratory: Negative for shortness of breath.   Cardiovascular: Negative for chest pain and leg swelling.  Gastrointestinal: Negative for nausea and vomiting.  Musculoskeletal: Positive for back pain and myalgias. Negative for arthralgias.  Skin: Negative for color change, rash and wound.  Neurological: Negative for dizziness, syncope, weakness, light-headedness and headaches.     Physical Exam Triage Vital Signs ED Triage Vitals  Enc Vitals Group     BP 04/13/20 1719 123/67     Pulse Rate 04/13/20 1719 65  Resp 04/13/20 1719 16     Temp 04/13/20 1719 98 F (36.7 C)     Temp Source 04/13/20 1719 Oral     SpO2 04/13/20 1719 97 %     Weight 04/13/20 1723 204 lb (92.5 kg)     Height 04/13/20 1723 6\' 1"  (1.854 m)     Head Circumference --      Peak Flow --      Pain Score 04/13/20 1723 7     Pain Loc --      Pain Edu? --      Excl. in GC? --    No data found.  Updated Vital Signs BP 123/67   Pulse 65   Temp 98 F (36.7 C) (Oral)   Resp 16   Ht 6\' 1"  (1.854 m)   Wt 204 lb (92.5 kg)   SpO2 97%   BMI 26.91 kg/m   Visual  Acuity Right Eye Distance:   Left Eye Distance:   Bilateral Distance:    Right Eye Near:   Left Eye Near:    Bilateral Near:     Physical Exam Vitals and nursing note reviewed.  Constitutional:      Appearance: He is well-developed.     Comments: No acute distress  HENT:     Head: Normocephalic and atraumatic.     Nose: Nose normal.  Eyes:     Conjunctiva/sclera: Conjunctivae normal.  Cardiovascular:     Rate and Rhythm: Normal rate.  Pulmonary:     Effort: Pulmonary effort is normal. No respiratory distress.  Abdominal:     General: There is no distension.  Musculoskeletal:        General: Normal range of motion.     Cervical back: Neck supple.     Comments: Back: Nontender to palpation of thoracic and lumbar spine midline, increased tenderness throughout right lateral lumbar musculature  Strength at hips and knees 5/5 and equal bilaterally, patellar reflex 2+ bilaterally  Skin:    General: Skin is warm and dry.  Neurological:     Mental Status: He is alert and oriented to person, place, and time.      UC Treatments / Results  Labs (all labs ordered are listed, but only abnormal results are displayed) Labs Reviewed  CYTOLOGY, (ORAL, ANAL, URETHRAL) ANCILLARY ONLY    EKG   Radiology No results found.  Procedures Procedures (including critical care time)  Medications Ordered in UC Medications - No data to display  Initial Impression / Assessment and Plan / UC Course  I have reviewed the triage vital signs and the nursing notes.  Pertinent labs & imaging results that were available during my care of the patient were reviewed by me and considered in my medical decision making (see chart for details).     1.  Right lumbar strain: Providing Flexeril and Celebrex for trial, follow-up with sports medicine as planned, continue PT.  2.  STD screening: Urethral swab pending, currently asymptomatic.  Will provide treatment based off results.  Discussed strict  return precautions. Patient verbalized understanding and is agreeable with plan.  Final Clinical Impressions(s) / UC Diagnoses   Final diagnoses:  Acute right-sided low back pain without sciatica  Screen for STD (sexually transmitted disease)     Discharge Instructions     Celebrex twice daily with food as needed for pain You may use flexeril as needed to help with pain. This is a muscle relaxer and causes sedation- please use only at bedtime  or when you will be home and not have to drive/work Gentle stretching- see attached Follow up with sports medicine as planned   ED Prescriptions    Medication Sig Dispense Auth. Provider   cyclobenzaprine (FLEXERIL) 5 MG tablet Take 1-2 tablets (5-10 mg total) by mouth 2 (two) times daily as needed for muscle spasms. 24 tablet Adyson Vanburen C, PA-C   celecoxib (CELEBREX) 100 MG capsule Take 1 capsule (100 mg total) by mouth 2 (two) times daily. With food 20 capsule Helga Asbury, Homer C Jones C, PA-C     PDMP not reviewed this encounter.   Janith Lima, [place] 04/13/20 1816

## 2020-04-14 LAB — CYTOLOGY, (ORAL, ANAL, URETHRAL) ANCILLARY ONLY
Chlamydia: NEGATIVE
Comment: NEGATIVE
Comment: NEGATIVE
Comment: NORMAL
Neisseria Gonorrhea: NEGATIVE
Trichomonas: NEGATIVE

## 2020-04-18 ENCOUNTER — Encounter: Payer: Self-pay | Admitting: Family Medicine

## 2020-04-18 ENCOUNTER — Ambulatory Visit (INDEPENDENT_AMBULATORY_CARE_PROVIDER_SITE_OTHER): Payer: 59 | Admitting: Family Medicine

## 2020-04-18 ENCOUNTER — Other Ambulatory Visit: Payer: Self-pay

## 2020-04-18 VITALS — BP 136/76 | Ht 73.0 in | Wt 204.0 lb

## 2020-04-18 DIAGNOSIS — M545 Low back pain, unspecified: Secondary | ICD-10-CM

## 2020-04-18 MED ORDER — PREDNISONE 10 MG PO TABS: ORAL_TABLET | ORAL | 0 refills | Status: DC

## 2020-04-18 MED ORDER — BACLOFEN 10 MG PO TABS
10.0000 mg | ORAL_TABLET | Freq: Three times a day (TID) | ORAL | 1 refills | Status: DC | PRN
Start: 2020-04-18 — End: 2022-10-22
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
## 2020-04-18 NOTE — Patient Instructions (Signed)
You have a severe lumbar strain. Prednisone dose pack x 6 days - take as directed. STOP the celebrex and don't take while you're on the prednisone. Baclofen up to 3 times a day as needed for spasms. Out of work for 1 week - follow up with me at that time. Heat 15 minutes at a time 3-4 times a day. Continue with physical therapy.

## 2020-04-21 ENCOUNTER — Ambulatory Visit (HOSPITAL_COMMUNITY)
Admission: EM | Admit: 2020-04-21 | Discharge: 2020-04-21 | Disposition: A | Discharge status: Home / Self Care | Payer: 59 | Attending: Family Medicine | Admitting: Family Medicine

## 2020-04-21 ENCOUNTER — Other Ambulatory Visit: Payer: Self-pay

## 2020-04-21 ENCOUNTER — Encounter (HOSPITAL_COMMUNITY): Payer: Self-pay

## 2020-04-21 DIAGNOSIS — J039 Acute tonsillitis, unspecified: Secondary | ICD-10-CM

## 2020-04-21 MED ORDER — AMOXICILLIN 400 MG/5ML PO SUSR
ORAL | 0 refills | Status: DC
Start: 2020-04-21 — End: 2020-04-25

## 2020-04-21 NOTE — Discharge Instructions (Signed)
If not allergic, you may use over the counter ibuprofen or acetaminophen as needed for fever and pain.  

## 2020-04-21 NOTE — ED Triage Notes (Signed)
Pt is here with swollen tonsils for 3 days now, pt has taken benadryl, throat spray to relieve discomfort.

## 2020-04-21 NOTE — ED Provider Notes (Signed)
[facility]   [id number] 04/21/20 Arrival Time: 1036  ASSESSMENT & PLAN:  1. Tonsillitis     Will treat and cover for tonsil infection; want to avoid development of abscess. Discussed.  Begin: Meds ordered this encounter  Medications  . amoxicillin (AMOXIL) 400 MG/5ML suspension    Sig: Take 10 ml twice daily for one week.    Dispense:  140 mL    Refill:  0   OTC analgesics and throat care as needed  Instructed to finish full course of antibiotics. Will follow up if not showing significant improvement or if worsening over the next 48 hours.    Discharge Instructions     If not allergic, you may use over the counter ibuprofen or acetaminophen as needed for fever and pain.    Reviewed expectations re: course of current medical issues. Questions answered. Outlined signs and symptoms indicating need for more acute intervention. Patient verbalized understanding. After Visit Summary given.   SUBJECTIVE:  Carl [name] is a 29 y.o. male who reports a L-sided sore throat. Describes as sharp pain with swallowing. Onset abrupt beginning first noted 2-3 d ago. Symptoms have gradually worsened since beginning; without voice changes. No respiratory symptoms. Decreased PO intake secondary to pain. No specific alleviating factors. Fever reported: Fever: believed to be present, temp not taken; with chills. No neck swelling. No associated nausea, vomiting, or abdominal pain. Known sick contacts: none. Recent travel: none. OTC treatment: none.    OBJECTIVE:  Vitals:   04/21/20 1108 04/21/20 1110  BP:  133/83  Pulse:  60  Resp:  18  Temp:  98.6 F (37 C)  TempSrc:  Oral  SpO2:  97%  Weight: 93 kg      General appearance: alert; no distress HEENT: throat with L-sided erythematous tonsil hypertrophy; uvula is midline Neck: supple with FROM; L-sided cervical LAD that is TTP Lungs: clear to auscultation bilaterally Abd: soft; non-tender Skin: reveals no rash;  warm and dry Psychological: alert and cooperative; normal mood and affect  Allergies  Allergen Reactions  . Nsaids Nausea And Vomiting    Can't take aleve but can take ibuprofen     Past Medical History:  Diagnosis Date  . Asthma   . Heart murmur    Social History   Socioeconomic History  . Marital status: Single    Spouse name: Not on file  . Number of children: Not on file  . Years of education: Not on file  . Highest education level: Not on file  Occupational History  . Not on file  Tobacco Use  . Smoking status: Never Smoker  . Smokeless tobacco: Never Used  Vaping Use  . Vaping Use: Never used  Substance and Sexual Activity  . Alcohol use: Yes    Alcohol/week: 0.0 standard drinks    Comment: social  . Drug use: No  . Sexual activity: Yes    Birth control/protection: None  Other Topics Concern  . Not on file  Social History Narrative  . Not on file   Social Determinants of Health   Financial Resource Strain:   . Difficulty of Paying Living Expenses:   Food Insecurity:   . Worried About Programme researcher, broadcasting/film/video in the Last Year:   . Barista in the Last Year:   Transportation Needs:   . Freight forwarder (Medical):   Marland Kitchen Lack of Transportation (Non-Medical):   Physical Activity:   . Days of Exercise per Week:   .  Minutes of Exercise per Session:   Stress:   . Feeling of Stress :   Social Connections:   . Frequency of Communication with Friends and Family:   . Frequency of Social Gatherings with Friends and Family:   . Attends Religious Services:   . Active Member of Clubs or Organizations:   . Attends Archivist Meetings:   Marland Kitchen Marital Status:   Intimate Partner Violence:   . Fear of Current or Ex-Partner:   . Emotionally Abused:   Marland Kitchen Physically Abused:   . Sexually Abused:    Family History  Problem Relation Age of Onset  . Healthy Mother   . Healthy Father           Vanessa Kick, MD 04/21/20 [phone]

## 2020-04-25 ENCOUNTER — Encounter: Payer: Self-pay | Admitting: Family Medicine

## 2020-04-25 ENCOUNTER — Other Ambulatory Visit: Payer: Self-pay

## 2020-04-25 ENCOUNTER — Ambulatory Visit (INDEPENDENT_AMBULATORY_CARE_PROVIDER_SITE_OTHER): Payer: 59 | Admitting: Family Medicine

## 2020-04-25 VITALS — BP 132/70 | Ht 73.0 in | Wt 204.0 lb

## 2020-04-25 DIAGNOSIS — M545 Low back pain, unspecified: Secondary | ICD-10-CM

## 2020-04-25 NOTE — Patient Instructions (Addendum)
You can take the celebrex (celecoxib) up to twice a day now - I would take this with food before you went to work. Heat 15 minutes at a time 3-4 times a day as needed. We will slowly progress you back to work as we discussed. Follow up with me in 4 weeks for reevaluation.
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
## 2020-05-23 ENCOUNTER — Ambulatory Visit: Payer: 59 | Admitting: Family Medicine

## 2021-05-13 ENCOUNTER — Ambulatory Visit (HOSPITAL_COMMUNITY)
Admission: EM | Admit: 2021-05-13 | Discharge: 2021-05-13 | Disposition: A | Discharge status: Home / Self Care | Payer: 59 | Attending: Family Medicine | Admitting: Family Medicine

## 2021-05-13 ENCOUNTER — Encounter (HOSPITAL_COMMUNITY): Payer: Self-pay

## 2021-05-13 ENCOUNTER — Other Ambulatory Visit: Payer: Self-pay

## 2021-05-13 DIAGNOSIS — M545 Low back pain, unspecified: Secondary | ICD-10-CM

## 2021-05-13 DIAGNOSIS — S39012A Strain of muscle, fascia and tendon of lower back, initial encounter: Secondary | ICD-10-CM

## 2021-05-13 MED ORDER — CYCLOBENZAPRINE HCL 10 MG PO TABS: 10.0000 mg | ORAL_TABLET | Freq: Two times a day (BID) | ORAL | 0 refills | Status: DC | PRN

## 2021-05-13 MED ORDER — PREDNISONE 20 MG PO TABS: 40.0000 mg | ORAL_TABLET | Freq: Every day | ORAL | 0 refills | Status: AC

## 2021-05-13 NOTE — Discharge Instructions (Addendum)
I have sent in prednisone for you to take 2 tablets daily for 5 days  I have sent in flexeril for you to take twice a day as needed for muscle spasms. This medication can make you sleepy. Do not drive or operate heavy machinery with this medication.  Follow up with this office or with primary care if symptoms are persisting.  Follow up in the ER for high fever, trouble swallowing, trouble breathing, other concerning symptoms.
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
## 2021-09-09 ENCOUNTER — Ambulatory Visit (HOSPITAL_COMMUNITY): Payer: Self-pay

## 2021-09-11 ENCOUNTER — Encounter (HOSPITAL_COMMUNITY): Payer: Self-pay

## 2021-09-11 ENCOUNTER — Ambulatory Visit (HOSPITAL_COMMUNITY)
Admission: RE | Admit: 2021-09-11 | Discharge: 2021-09-11 | Disposition: A | Discharge status: Home / Self Care | Payer: Self-pay | Source: Ambulatory Visit | Attending: Emergency Medicine | Admitting: Emergency Medicine

## 2021-09-11 ENCOUNTER — Other Ambulatory Visit: Payer: Self-pay

## 2021-09-11 VITALS — BP 143/66 | HR 67 | Temp 98.3°F | Resp 16

## 2021-09-11 DIAGNOSIS — Z113 Encounter for screening for infections with a predominantly sexual mode of transmission: Secondary | ICD-10-CM

## 2021-09-11 LAB — HIV ANTIBODY (ROUTINE TESTING W REFLEX): HIV Screen 4th Generation wRfx: NONREACTIVE

## 2021-09-11 NOTE — ED Triage Notes (Signed)
Pt presents for STD's test. Denies any symptoms.  

## 2021-09-11 NOTE — Discharge Instructions (Signed)
Labs pending 2-3 days, you will be contacted if positive for any sti and treatment will be sent to the pharmacy, you will have to return to the clinic if positive for gonorrhea to receive treatment   Please refrain from having sex until labs results, if positive please refrain from having sex until treatment complete and symptoms resolve   If positive for HIV, Syphilis, Chlamydia  gonorrhea or trichomoniasis please notify partner or partners so they may tested as well  Moving forward, it is recommended you use some form of protection against the transmission of sti infections  such as condoms or dental dams with each sexual encounter   

## 2021-09-12 LAB — CYTOLOGY, (ORAL, ANAL, URETHRAL) ANCILLARY ONLY
Chlamydia: NEGATIVE
Comment: NEGATIVE
Comment: NEGATIVE
Comment: NORMAL
Neisseria Gonorrhea: NEGATIVE
Trichomonas: NEGATIVE

## 2021-09-12 LAB — RPR: RPR Ser Ql: NONREACTIVE

## 2021-09-12 NOTE — ED Provider Notes (Signed)
[facility]    CSN: [id number] Arrival date & time: 09/11/21  1803      History   Chief Complaint Chief Complaint  Patient presents with   Appointment    1800   Exposure to STD    HPI Carl [name] is a 30 y.o. male.   Patient presents requesting STI testing.  Denies all symptoms.  Denies known exposure.  Sexually active, 1 partner, no condom use.  Past Medical History:  Diagnosis Date   Asthma    Heart murmur     Patient Active Problem List   Diagnosis Date Noted   Low back pain 02/17/2015    History reviewed. No pertinent surgical history.     Home Medications    Prior to Admission medications   Medication Sig Start Date End Date Taking? Authorizing Provider  baclofen (LIORESAL) 10 MG tablet Take 1 tablet (10 mg total) by mouth 3 (three) times daily as needed for muscle spasms. 04/18/20   Hudnall, Sharyn Lull, MD  celecoxib (CELEBREX) 100 MG capsule Take 1 capsule (100 mg total) by mouth 2 (two) times daily. With food 04/13/20   Wieters, Hallie C, PA-C  cyclobenzaprine (FLEXERIL) 10 MG tablet Take 1 tablet (10 mg total) by mouth 2 (two) times daily as needed for muscle spasms. 05/13/21   Faustino Congress, NP  albuterol (PROVENTIL HFA;VENTOLIN HFA) 108 (90 BASE) MCG/ACT inhaler Inhale 1-2 puffs into the lungs every 6 (six) hours as needed for wheezing or shortness of breath. Patient not taking: Reported on 02/01/2019 12/24/14 11/25/19  Quintella Reichert, MD  cetirizine (ZYRTEC) 10 MG tablet Take 1 tablet (10 mg total) by mouth daily. 10/02/19 04/13/20  Zigmund Gottron, NP  omeprazole (PRILOSEC) 20 MG capsule Take 1 capsule (20 mg total) by mouth daily. Patient not taking: Reported on 02/01/2019 12/24/14 11/25/19  Quintella Reichert, MD    Family History Family History  Problem Relation Age of Onset   Healthy Mother    Healthy Father     Social History Social History   Tobacco Use   Smoking status: Never   Smokeless tobacco: Never  Vaping Use   Vaping  Use: Never used  Substance Use Topics   Alcohol use: Yes    Alcohol/week: 0.0 standard drinks    Comment: social   Drug use: No     Allergies   Nsaids   Review of Systems Review of Systems  Constitutional: Negative.   Respiratory: Negative.    Cardiovascular: Negative.   Gastrointestinal: Negative.   Genitourinary: Negative.   Skin: Negative.     Physical Exam Triage Vital Signs ED Triage Vitals  Enc Vitals Group     BP 09/11/21 1828 (!) 143/66     Pulse Rate 09/11/21 1828 67     Resp 09/11/21 1828 16     Temp 09/11/21 1828 98.3 F (36.8 C)     Temp Source 09/11/21 1828 Oral     SpO2 09/11/21 1828 99 %     Weight --      Height --      Head Circumference --      Peak Flow --      Pain Score 09/11/21 1827 0     Pain Loc --      Pain Edu? --      Excl. in [place]? --    No data found.  Updated Vital Signs BP (!) 143/66 (BP Location: Left Arm)   Pulse 67   Temp 98.3 F (  36.8 C) (Oral)   Resp 16   SpO2 99%   Visual Acuity Right Eye Distance:   Left Eye Distance:   Bilateral Distance:    Right Eye Near:   Left Eye Near:    Bilateral Near:     Physical Exam Constitutional:      Appearance: Normal appearance. He is normal weight.  Pulmonary:     Effort: Pulmonary effort is normal.  Genitourinary:    Comments: Deferred, self collect urethral swab Neurological:     Mental Status: He is alert and oriented to person, place, and time. Mental status is at baseline.  Psychiatric:        Mood and Affect: Mood normal.        Behavior: Behavior normal.     UC Treatments / Results  Labs (all labs ordered are listed, but only abnormal results are displayed) Labs Reviewed  HIV ANTIBODY (ROUTINE TESTING W REFLEX)  RPR  CYTOLOGY, (ORAL, ANAL, URETHRAL) ANCILLARY ONLY    EKG   Radiology No results found.  Procedures Procedures (including critical care time)  Medications Ordered in UC Medications - No data to display  Initial Impression /  Assessment and Plan / UC Course  I have reviewed the triage vital signs and the nursing notes.  Pertinent labs & imaging results that were available during my care of the patient were reviewed by me and considered in my medical decision making (see chart for details).  Routine screening for STI  1.  Urethral swab pending, will treat per protocol, advised abstinence until labs and/or treatment is complete, advised condom use moving forward Final Clinical Impressions(s) / UC Diagnoses   Final diagnoses:  Routine screening for STI (sexually transmitted infection)     Discharge Instructions      Labs pending 2-3 days, you will be contacted if positive for any sti and treatment will be sent to the pharmacy, you will have to return to the clinic if positive for gonorrhea to receive treatment   Please refrain from having sex until labs results, if positive please refrain from having sex until treatment complete and symptoms resolve   If positive for HIV, Syphilis, Chlamydia  gonorrhea or trichomoniasis please notify partner or partners so they may tested as well  Moving forward, it is recommended you use some form of protection against the transmission of sti infections  such as condoms or dental dams with each sexual encounter     ED Prescriptions   None    PDMP not reviewed this encounter.   Valinda Hoar, [place] 09/12/21 [phone]

## 2021-10-17 ENCOUNTER — Ambulatory Visit (HOSPITAL_COMMUNITY): Payer: Self-pay
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
## 2022-03-04 ENCOUNTER — Encounter (HOSPITAL_COMMUNITY): Payer: Self-pay

## 2022-03-04 ENCOUNTER — Ambulatory Visit (HOSPITAL_COMMUNITY)
Admission: RE | Admit: 2022-03-04 | Discharge: 2022-03-04 | Disposition: A | Discharge status: Home / Self Care | Payer: Self-pay | Source: Ambulatory Visit | Attending: Nurse Practitioner | Admitting: Nurse Practitioner

## 2022-03-04 VITALS — BP 124/67 | HR 67 | Temp 97.9°F | Resp 17

## 2022-03-04 DIAGNOSIS — Z113 Encounter for screening for infections with a predominantly sexual mode of transmission: Secondary | ICD-10-CM | POA: Insufficient documentation

## 2022-03-04 DIAGNOSIS — Z202 Contact with and (suspected) exposure to infections with a predominantly sexual mode of transmission: Secondary | ICD-10-CM

## 2022-03-04 NOTE — ED Triage Notes (Signed)
Pt presents today for STI screening. He denies sxs. No known STI exposures. ?

## 2022-03-04 NOTE — Discharge Instructions (Addendum)
-   We will let you know if any of the testing comes back positive and will prescribe treatment ?-Please wear condoms with every sexual encounter to prevent spread of STI ?

## 2022-03-04 NOTE — ED Provider Notes (Signed)
?[facility] ? ? ? ?CSN: [id number] ?Arrival date & time: 03/04/22  1500 ? ? ?  ? ?History   ?Chief Complaint ?Chief Complaint  ?Patient presents with  ? 315appt  ? S74.5  ? ? ?HPI ?Carl [name] is a 31 y.o. male.  ? ?Patient presents for STI screening today.  He denies any symptoms of STI today including penile discharge, pain in his groin, fevers, nausea/vomiting, dysuria, blood in his urine.  He denies any rashes, lesions, or open sores in his genitalia area.  He does report he feels a hard area/bump in his scrotum and is requesting me to look at that today.  He reports the bump is not painful, it seems to come and go, and has not changed much in size.  ? ? ?Past Medical History:  ?Diagnosis Date  ? Asthma   ? Heart murmur   ? ? ?Patient Active Problem List  ? Diagnosis Date Noted  ? Low back pain 02/17/2015  ? ? ?History reviewed. No pertinent surgical history. ? ? ? ? ?Home Medications   ? ?Prior to Admission medications   ?Medication Sig Start Date End Date Taking? Authorizing Provider  ?baclofen (LIORESAL) 10 MG tablet Take 1 tablet (10 mg total) by mouth 3 (three) times daily as needed for muscle spasms. 04/18/20   Lenda Kelp, MD  ?celecoxib (CELEBREX) 100 MG capsule Take 1 capsule (100 mg total) by mouth 2 (two) times daily. With food 04/13/20   Wieters, [employer], PA-C  ?cyclobenzaprine (FLEXERIL) 10 MG tablet Take 1 tablet (10 mg total) by mouth 2 (two) times daily as needed for muscle spasms. 05/13/21   Moshe Cipro, NP  ?albuterol (PROVENTIL HFA;VENTOLIN HFA) 108 (90 BASE) MCG/ACT inhaler Inhale 1-2 puffs into the lungs every 6 (six) hours as needed for wheezing or shortness of breath. ?Patient not taking: Reported on 02/01/2019 12/24/14 11/25/19  Tilden Fossa, MD  ?cetirizine (ZYRTEC) 10 MG tablet Take 1 tablet (10 mg total) by mouth daily. 10/02/19 04/13/20  Georgetta Haber, NP  ?omeprazole (PRILOSEC) 20 MG capsule Take 1 capsule (20 mg total) by mouth daily. ?Patient not taking:  Reported on 02/01/2019 12/24/14 11/25/19  Tilden Fossa, MD  ? ? ?Family History ?Family History  ?Problem Relation Age of Onset  ? Healthy Mother   ? Healthy Father   ? ? ?Social History ?Social History  ? ?Tobacco Use  ? Smoking status: Never  ? Smokeless tobacco: Never  ?Vaping Use  ? Vaping Use: Never used  ?Substance Use Topics  ? Alcohol use: Yes  ?  Alcohol/week: 0.0 standard drinks  ?  Comment: social  ? Drug use: No  ? ? ? ?Allergies   ?Nsaids ? ? ?Review of Systems ?Review of Systems ?Per HPI ? ?Physical Exam ?Triage Vital Signs ?ED Triage Vitals  ?Enc Vitals Group  ?   BP 03/04/22 1527 124/67  ?   Pulse Rate 03/04/22 1527 67  ?   Resp 03/04/22 1527 17  ?   Temp 03/04/22 1527 97.9 ?F (36.6 ?C)  ?   Temp Source 03/04/22 1527 Oral  ?   SpO2 03/04/22 1527 99 %  ?   Weight --   ?   Height --   ?   Head Circumference --   ?   Peak Flow --   ?   Pain Score 03/04/22 1526 0  ?   Pain Loc --   ?   Pain Edu? --   ?  Excl. in GC? --   ? ?No data found. ? ?Updated Vital Signs ?BP 124/67 (BP Location: Right Arm)   Pulse 67   Temp 97.9 ?F (36.6 ?C) (Oral)   Resp 17   SpO2 99%  ? ?Visual Acuity ?Right Eye Distance:   ?Left Eye Distance:   ?Bilateral Distance:   ? ?Right Eye Near:   ?Left Eye Near:    ?Bilateral Near:    ? ?Physical Exam ?Vitals and nursing note reviewed. Exam conducted with a chaperone present Anselmo Rod Herrick, CMA).  ?Constitutional:   ?   General: He is not in acute distress. ?   Appearance: Normal appearance. He is not toxic-appearing.  ?Pulmonary:  ?   Effort: Pulmonary effort is normal. No respiratory distress.  ?Genitourinary: ?   Penis: Normal and circumcised. No erythema, tenderness, discharge or swelling.   ?   Testes: Normal.     ?   Right: Mass or tenderness not present.     ?   Left: Mass or tenderness not present.  ?Skin: ?   General: Skin is warm and dry.  ?   Capillary Refill: Capillary refill takes less than 2 seconds.  ?   Coloration: Skin is not jaundiced or pale.  ?Neurological:   ?   Mental Status: He is alert and oriented to person, place, and time.  ?   Motor: No weakness.  ?   Gait: Gait normal.  ?Psychiatric:     ?   Behavior: Behavior is cooperative.  ? ? ? ?UC Treatments / Results  ?Labs ?(all labs ordered are listed, but only abnormal results are displayed) ?Labs Reviewed  ?RPR  ?HIV ANTIBODY (ROUTINE TESTING W REFLEX)  ?CYTOLOGY, (ORAL, ANAL, URETHRAL) ANCILLARY ONLY  ? ? ?EKG ? ? ?Radiology ?No results found. ? ?Procedures ?Procedures (including critical care time) ? ?Medications Ordered in UC ?Medications - No data to display ? ?Initial Impression / Assessment and Plan / UC Course  ?I have reviewed the triage vital signs and the nursing notes. ? ?Pertinent labs & imaging results that were available during my care of the patient were reviewed by me and considered in my medical decision making (see chart for details). ? ?  ?We will screen for STI today via self swab for gonorrhea, chlamydia, trichomonas.  Also checking for HIV and syphilis.  We will treat as indicated.  Patient is not having symptoms, so no treatment will be given today.  On examination, I do not appreciate any hard bumps or lumps on his scrotum.  Encouraged follow up if bump persists or worsens. ?Final Clinical Impressions(s) / UC Diagnoses  ? ?Final diagnoses:  ?Routine screening for STI (sexually transmitted infection)  ? ? ? ?Discharge Instructions   ? ?  ?- We will let you know if any of the testing comes back positive and will prescribe treatment ?-Please wear condoms with every sexual encounter to prevent spread of STI ? ? ? ? ?ED Prescriptions   ?None ?  ? ?PDMP not reviewed this encounter. ?  ?Valentino Nose, NP ?03/04/22 1611 ? ?

## 2022-03-05 LAB — RPR: RPR Ser Ql: NONREACTIVE

## 2022-03-05 LAB — CYTOLOGY, (ORAL, ANAL, URETHRAL) ANCILLARY ONLY
Chlamydia: NEGATIVE
Comment: NEGATIVE
Comment: NEGATIVE
Comment: NORMAL
Neisseria Gonorrhea: NEGATIVE
Trichomonas: NEGATIVE

## 2022-03-06 LAB — HIV ANTIBODY (ROUTINE TESTING W REFLEX): HIV Screen 4th Generation wRfx: NONREACTIVE

## 2022-09-17 ENCOUNTER — Ambulatory Visit (HOSPITAL_COMMUNITY)
Admission: RE | Admit: 2022-09-17 | Discharge: 2022-09-17 | Disposition: A | Discharge status: Home / Self Care | Payer: Self-pay | Source: Ambulatory Visit | Attending: Physician Assistant | Admitting: Physician Assistant

## 2022-09-17 ENCOUNTER — Other Ambulatory Visit: Payer: Self-pay

## 2022-09-17 ENCOUNTER — Encounter (HOSPITAL_COMMUNITY): Payer: Self-pay

## 2022-09-17 VITALS — BP 123/72 | HR 60 | Temp 98.5°F | Resp 18

## 2022-09-17 DIAGNOSIS — Z113 Encounter for screening for infections with a predominantly sexual mode of transmission: Secondary | ICD-10-CM | POA: Insufficient documentation

## 2022-09-17 DIAGNOSIS — R300 Dysuria: Secondary | ICD-10-CM | POA: Insufficient documentation

## 2022-09-17 LAB — POCT URINALYSIS DIPSTICK, ED / UC
Bilirubin Urine: NEGATIVE
Glucose, UA: NEGATIVE mg/dL
Hgb urine dipstick: NEGATIVE
Ketones, ur: NEGATIVE mg/dL
Nitrite: NEGATIVE
Protein, ur: NEGATIVE mg/dL
Specific Gravity, Urine: 1.02 (ref 1.005–1.030)
Urobilinogen, UA: 0.2 mg/dL (ref 0.0–1.0)
pH: 7.5 (ref 5.0–8.0)

## 2022-09-17 NOTE — ED Provider Notes (Signed)
[facility]    CSN: [id number] Arrival date & time: 09/17/22  1629      History   Chief Complaint Chief Complaint  Patient presents with   Urinary Frequency   Appointment    4:30    HPI Carl [name] is a 31 y.o. male.   Pt reports a "funny feeling" when urinating.  Reports it is not too painful, but feels uncomfortable.  He denies penile discharge, rash, redness or irritation.  Denies hematuria.  He has tried nothing for the sx.      Past Medical History:  Diagnosis Date   Asthma    Heart murmur     Patient Active Problem List   Diagnosis Date Noted   Low back pain 02/17/2015    History reviewed. No pertinent surgical history.     Home Medications    Prior to Admission medications   Medication Sig Start Date End Date Taking? Authorizing Provider  baclofen (LIORESAL) 10 MG tablet Take 1 tablet (10 mg total) by mouth 3 (three) times daily as needed for muscle spasms. Patient not taking: Reported on 09/17/2022 04/18/20   Lenda Kelp, MD  celecoxib (CELEBREX) 100 MG capsule Take 1 capsule (100 mg total) by mouth 2 (two) times daily. With food Patient not taking: Reported on 09/17/2022 04/13/20   Wieters, Hallie C, PA-C  cyclobenzaprine (FLEXERIL) 10 MG tablet Take 1 tablet (10 mg total) by mouth 2 (two) times daily as needed for muscle spasms. Patient not taking: Reported on 09/17/2022 05/13/21   Moshe Cipro, NP  albuterol (PROVENTIL HFA;VENTOLIN HFA) 108 (90 BASE) MCG/ACT inhaler Inhale 1-2 puffs into the lungs every 6 (six) hours as needed for wheezing or shortness of breath. Patient not taking: Reported on 02/01/2019 12/24/14 11/25/19  Tilden Fossa, MD  cetirizine (ZYRTEC) 10 MG tablet Take 1 tablet (10 mg total) by mouth daily. 10/02/19 04/13/20  Georgetta Haber, NP  omeprazole (PRILOSEC) 20 MG capsule Take 1 capsule (20 mg total) by mouth daily. Patient not taking: Reported on 02/01/2019 12/24/14 11/25/19  Tilden Fossa, MD    Family  History Family History  Problem Relation Age of Onset   Healthy Mother    Healthy Father     Social History Social History   Tobacco Use   Smoking status: Never   Smokeless tobacco: Never  Vaping Use   Vaping Use: Some days  Substance Use Topics   Alcohol use: Yes    Alcohol/week: 0.0 standard drinks of alcohol    Comment: social   Drug use: No     Allergies   Nsaids   Review of Systems Review of Systems  Constitutional:  Negative for chills and fever.  HENT:  Negative for ear pain and sore throat.   Eyes:  Negative for pain and visual disturbance.  Respiratory:  Negative for cough and shortness of breath.   Cardiovascular:  Negative for chest pain and palpitations.  Gastrointestinal:  Negative for abdominal pain and vomiting.  Genitourinary:  Positive for dysuria. Negative for hematuria.  Musculoskeletal:  Negative for arthralgias and back pain.  Skin:  Negative for color change and rash.  Neurological:  Negative for seizures and syncope.  All other systems reviewed and are negative.    Physical Exam Triage Vital Signs ED Triage Vitals  Enc Vitals Group     BP 09/17/22 1701 123/72     Pulse Rate 09/17/22 1701 60     Resp 09/17/22 1701 18     Temp  09/17/22 1701 98.5 F (36.9 C)     Temp Source 09/17/22 1701 Oral     SpO2 09/17/22 1701 98 %     Weight --      Height --      Head Circumference --      Peak Flow --      Pain Score 09/17/22 1659 6     Pain Loc --      Pain Edu? --      Excl. in GC? --    No data found.  Updated Vital Signs BP 123/72 (BP Location: Right Arm) Comment (BP Location): large cuff  Pulse 60   Temp 98.5 F (36.9 C) (Oral)   Resp 18   SpO2 98%   Visual Acuity Right Eye Distance:   Left Eye Distance:   Bilateral Distance:    Right Eye Near:   Left Eye Near:    Bilateral Near:     Physical Exam Vitals and nursing note reviewed.  Constitutional:      General: He is not in acute distress.    Appearance: He is  well-developed.  HENT:     Head: Normocephalic and atraumatic.  Eyes:     Conjunctiva/sclera: Conjunctivae normal.  Cardiovascular:     Rate and Rhythm: Normal rate and regular rhythm.     Heart sounds: No murmur heard. Pulmonary:     Effort: Pulmonary effort is normal. No respiratory distress.     Breath sounds: Normal breath sounds.  Abdominal:     Palpations: Abdomen is soft.     Tenderness: There is no abdominal tenderness.  Musculoskeletal:        General: No swelling.     Cervical back: Neck supple.  Skin:    General: Skin is warm and dry.     Capillary Refill: Capillary refill takes less than 2 seconds.  Neurological:     Mental Status: He is alert.  Psychiatric:        Mood and Affect: Mood normal.      UC Treatments / Results  Labs (all labs ordered are listed, but only abnormal results are displayed) Labs Reviewed  POCT URINALYSIS DIPSTICK, ED / UC - Abnormal; Notable for the following components:      Result Value   Leukocytes,Ua TRACE (*)    All other components within normal limits  CYTOLOGY, (ORAL, ANAL, URETHRAL) ANCILLARY ONLY    EKG   Radiology No results found.  Procedures Procedures (including critical care time)  Medications Ordered in UC Medications - No data to display  Initial Impression / Assessment and Plan / UC Course  I have reviewed the triage vital signs and the nursing notes.  Pertinent labs & imaging results that were available during my care of the patient were reviewed by me and considered in my medical decision making (see chart for details).     Dysuria, UA normal.  Cytology self swab today in clinic, results pending.  Will treat if indicated based on results.  Safe sex practices discussed.  Return precautions discussed. Final Clinical Impressions(s) / UC Diagnoses   Final diagnoses:  Dysuria  Screen for sexually transmitted diseases     Discharge Instructions      Your urine is negative for urinary tract  infection.  Lab results pending, will treat if indicated based on results.  Abstain from sexual activity until test results are back and any necessary treatment is completed.  Return if you develop new or worsening symptoms.  Drink  plenty of fluids.    ED Prescriptions   None    PDMP not reviewed this encounter.   Ward, Lenise Arena, PA-C 09/17/22 [phone]

## 2022-09-17 NOTE — ED Triage Notes (Signed)
1 1/2 weeks ago noticed "funny " feeling with urination.  Denies penile discharge.  Denies back pain, denies stomach pain

## 2022-09-17 NOTE — Discharge Instructions (Signed)
Your urine is negative for urinary tract infection.  Lab results pending, will treat if indicated based on results.  Abstain from sexual activity until test results are back and any necessary treatment is completed.  Return if you develop new or worsening symptoms.  Drink plenty of fluids.

## 2022-09-18 LAB — CYTOLOGY, (ORAL, ANAL, URETHRAL) ANCILLARY ONLY
Comment: NEGATIVE
Trichomonas: NEGATIVE

## 2022-10-22 ENCOUNTER — Ambulatory Visit (HOSPITAL_COMMUNITY)
Admission: RE | Admit: 2022-10-22 | Discharge: 2022-10-22 | Disposition: A | Discharge status: Home / Self Care | Payer: Self-pay | Source: Ambulatory Visit | Attending: Internal Medicine | Admitting: Internal Medicine

## 2022-10-22 ENCOUNTER — Encounter (HOSPITAL_COMMUNITY): Payer: Self-pay

## 2022-10-22 ENCOUNTER — Other Ambulatory Visit: Payer: Self-pay

## 2022-10-22 VITALS — BP 129/80 | HR 57 | Temp 98.5°F | Resp 18

## 2022-10-22 DIAGNOSIS — J029 Acute pharyngitis, unspecified: Secondary | ICD-10-CM

## 2022-10-22 DIAGNOSIS — Z113 Encounter for screening for infections with a predominantly sexual mode of transmission: Secondary | ICD-10-CM

## 2022-10-22 LAB — HIV ANTIBODY (ROUTINE TESTING W REFLEX): HIV Screen 4th Generation wRfx: NONREACTIVE

## 2022-10-22 LAB — POCT RAPID STREP A, ED / UC: Streptococcus, Group A Screen (Direct): NEGATIVE

## 2022-10-22 MED ORDER — AMOXICILLIN 500 MG PO CAPS: 500.0000 mg | ORAL_CAPSULE | Freq: Two times a day (BID) | ORAL | 0 refills | Status: DC

## 2022-10-22 NOTE — ED Provider Notes (Signed)
[facility]    CSN: [id number] Arrival date & time: 10/22/22  1542      History   Chief Complaint Chief Complaint  Patient presents with   Sore Throat    A lot of mucus hard to swallow - Entered by patient   Appointment    16:00    HPI Carl [name] is a 31 y.o. male.  Patient presents complaining of sore throat x 3 days.  Patient reports sore throat has progressively worsened and he reports that it is painful to swallow.  Patient reports having a sexual encounter with a male, he is concerned for possible STD exposure and/or possibly catching strep from this encounter.  Patient reports having strep in the past and this episode feeling similar.  Patient reports cervical lymph node pain. Patient has not taken any medications for symptoms.   Patient reports a "lump" on LFT side of testicle. Patient reports area has been present for approximately 1 year per patient statement.  Patient states he noticed the area when examining his testicles.  Patient reports that the lump has remained the same size and has not changed.  Patient reports he has not talked to any provider about this area.    Sore Throat    Past Medical History:  Diagnosis Date   Asthma    Heart murmur     Patient Active Problem List   Diagnosis Date Noted   Low back pain 02/17/2015    History reviewed. No pertinent surgical history.     Home Medications    Prior to Admission medications   Medication Sig Start Date End Date Taking? Authorizing Provider  amoxicillin (AMOXIL) 500 MG capsule Take 1 capsule (500 mg total) by mouth 2 (two) times daily. 10/22/22  Yes Debby Freiberg, NP  albuterol (PROVENTIL HFA;VENTOLIN HFA) 108 (90 BASE) MCG/ACT inhaler Inhale 1-2 puffs into the lungs every 6 (six) hours as needed for wheezing or shortness of breath. Patient not taking: Reported on 02/01/2019 12/24/14 11/25/19  Tilden Fossa, MD  cetirizine (ZYRTEC) 10 MG tablet Take 1 tablet (10 mg total)  by mouth daily. 10/02/19 04/13/20  Georgetta Haber, NP  omeprazole (PRILOSEC) 20 MG capsule Take 1 capsule (20 mg total) by mouth daily. Patient not taking: Reported on 02/01/2019 12/24/14 11/25/19  Tilden Fossa, MD    Family History Family History  Problem Relation Age of Onset   Healthy Mother    Healthy Father     Social History Social History   Tobacco Use   Smoking status: Never   Smokeless tobacco: Never  Vaping Use   Vaping Use: Some days  Substance Use Topics   Alcohol use: Yes    Alcohol/week: 0.0 standard drinks of alcohol    Comment: social   Drug use: No     Allergies   Nsaids   Review of Systems Review of Systems  Constitutional:  Negative for activity change, appetite change, chills, fatigue and fever.  HENT:  Positive for sore throat. Negative for congestion, ear discharge, ear pain, rhinorrhea, sinus pressure, sinus pain, tinnitus and voice change.   Eyes: Negative.   Respiratory: Negative.    Cardiovascular: Negative.   Gastrointestinal: Negative.   Genitourinary:  Negative for decreased urine volume, difficulty urinating, dysuria, enuresis, flank pain, frequency, genital sores, hematuria, penile discharge, penile pain, penile swelling, scrotal swelling, testicular pain and urgency.       Painless lump patient reports on LFT side of testicle  Physical Exam Triage Vital Signs ED Triage Vitals  Enc Vitals Group     BP 10/22/22 1706 129/80     Pulse Rate 10/22/22 1706 (!) 57     Resp 10/22/22 1706 18     Temp 10/22/22 1706 98.5 F (36.9 C)     Temp src --      SpO2 10/22/22 1706 98 %     Weight --      Height --      Head Circumference --      Peak Flow --      Pain Score 10/22/22 1705 10     Pain Loc --      Pain Edu? --      Excl. in GC? --    No data found.  Updated Vital Signs BP 129/80   Pulse (!) 57   Temp 98.5 F (36.9 C)   Resp 18   SpO2 98%       Physical Exam Vitals and nursing note reviewed. Chaperone present:  [place] Bing.  Constitutional:      Appearance: He is well-developed.  HENT:     Mouth/Throat:     Mouth: Mucous membranes are moist.     Dentition: Normal dentition.     Pharynx: Uvula midline. Posterior oropharyngeal erythema present. No pharyngeal swelling or uvula swelling.     Tonsils: Tonsillar exudate present. No tonsillar abscesses. 1+ on the right. 2+ on the left.  Genitourinary:    Penis: Normal and circumcised. No erythema, tenderness, discharge, swelling or lesions.      Testes:        Right: Mass, tenderness, swelling, testicular hydrocele or varicocele not present. Right testis is descended. Cremasteric reflex is present.         Left: Mass (Nodular area approximatley 2 cm in diameter noted within LFT testicle upon palpation posteriorly, unclear as to whether this is an extension of epididymis. No pain upon palpation.) present. Tenderness, swelling, testicular hydrocele or varicocele not present. Left testis is descended. Cremasteric reflex is present.      Epididymis:     Right: Not inflamed or enlarged. No mass or tenderness.     Left: Normal. Not inflamed or enlarged. No mass or tenderness.     Comments: Skin on penile and scrotum appear normal with no rash present.  Lymphadenopathy:     Head:     Right side of head: No tonsillar adenopathy.     Left side of head: Tonsillar adenopathy present.     Cervical: Cervical adenopathy present.     Right cervical: Deep cervical adenopathy present. No superficial or posterior cervical adenopathy.    Left cervical: Deep cervical adenopathy present. No superficial or posterior cervical adenopathy.  Neurological:     Mental Status: He is alert.      UC Treatments / Results  Labs (all labs ordered are listed, but only abnormal results are displayed) Labs Reviewed  CULTURE, GROUP A STREP ([facility])  RPR  HIV ANTIBODY (ROUTINE TESTING W REFLEX)  POCT RAPID STREP A, ED / UC  CYTOLOGY, (ORAL, ANAL, URETHRAL) ANCILLARY ONLY  CYTOLOGY,  (ORAL, ANAL, URETHRAL) ANCILLARY ONLY    EKG   Radiology No results found.  Procedures Procedures (including critical care time)  Medications Ordered in UC Medications - No data to display  Initial Impression / Assessment and Plan / UC Course  I have reviewed the triage vital signs and the nursing notes.  Pertinent labs & imaging results that were available  during my care of the patient were reviewed by me and considered in my medical decision making (see chart for details).     Patient was evaluated for pharyngitis.  POC strep was negative in office.  Strep culture is pending.  Shared decision making was used to empirically treat for strep pharyngitis, based on presentation and symptomology.  Amoxicillin prescription was sent to the pharmacy.  Patient was made aware of treatment regiment and possible side effects.  Cytology of oropharynx ordered to verify STD is not the causative agent of pharyngitis.   STD screening with penile cytology and blood work was ordered per patient request.  Left testicle examined, low suspicion for a cancerous etiology being that the nodule has not changed within the past year and patient denies any signs of the area becoming larger.  Shared decision-making was used to determine patient should schedule appointment with a PCP and establish care, patient was made aware that his PCP should order a ultrasound of the area where the nodule is present.  Patient was shown how to schedule a PCP appointment on MyChart and patient stated he would follow through with scheduling appointment.  Patient made aware of timeline for symptom resolution and when follow-up would be necessary.  Patient made aware of results reporting protocol and MyChart.  Patient verbalized understanding of instructions.    Charting was provided using a a verbal dictation system, charting was proofread for errors, errors may occur which could change the meaning of the information charted.   Final  Clinical Impressions(s) / UC Diagnoses   Final diagnoses:  Pharyngitis, unspecified etiology  Screening examination for STD (sexually transmitted disease)     Discharge Instructions      We will call you if any of your tests warrant a change in your plan of care.  You may view your test results on MyChart.  Amoxicillin is the antibiotic this been sent to the pharmacy, you will take this 2 times daily for the next 10 days.  It is best to put food on your stomach when taking this medication to avoid any upset stomach that can be a side effect of the medication.   Please follow-up if your symptoms are not improving.   At this time please refrain from any sexual activity until receiving all your test results.      ED Prescriptions     Medication Sig Dispense Auth. Provider   amoxicillin (AMOXIL) 500 MG capsule Take 1 capsule (500 mg total) by mouth 2 (two) times daily. 20 capsule Debby Freiberg, NP      PDMP not reviewed this encounter.   Debby Freiberg, NP 10/22/22 1828

## 2022-10-22 NOTE — ED Notes (Signed)
No answer

## 2022-10-22 NOTE — ED Notes (Signed)
At bedside for provider exam of patient 

## 2022-10-22 NOTE — Discharge Instructions (Addendum)
We will call you if any of your tests warrant a change in your plan of care.  You may view your test results on MyChart.  Amoxicillin is the antibiotic this been sent to the pharmacy, you will take this 2 times daily for the next 10 days.  It is best to put food on your stomach when taking this medication to avoid any upset stomach that can be a side effect of the medication.   Please follow-up if your symptoms are not improving.   At this time please refrain from any sexual activity until receiving all your test results.

## 2022-10-22 NOTE — ED Triage Notes (Addendum)
Pt reports a sore throat for 3 days. Pt reports he thinks he has Strep throat because he has had strep before.

## 2022-10-23 LAB — CYTOLOGY, (ORAL, ANAL, URETHRAL) ANCILLARY ONLY
Chlamydia: NEGATIVE
Chlamydia: NEGATIVE
Comment: NEGATIVE
Comment: NEGATIVE
Comment: NEGATIVE
Comment: NORMAL
Comment: NORMAL
Neisseria Gonorrhea: NEGATIVE
Neisseria Gonorrhea: NEGATIVE
Trichomonas: NEGATIVE

## 2022-10-23 LAB — RPR: RPR Ser Ql: NONREACTIVE

## 2022-10-24 LAB — CULTURE, GROUP A STREP (THRC)

## 2022-12-21 ENCOUNTER — Ambulatory Visit (HOSPITAL_COMMUNITY): Payer: Self-pay

## 2022-12-24 ENCOUNTER — Ambulatory Visit (HOSPITAL_COMMUNITY)
Admission: RE | Admit: 2022-12-24 | Discharge: 2022-12-24 | Disposition: A | Discharge status: Home / Self Care | Payer: Self-pay | Source: Ambulatory Visit | Attending: Physician Assistant | Admitting: Physician Assistant

## 2022-12-24 ENCOUNTER — Encounter (HOSPITAL_COMMUNITY): Payer: Self-pay

## 2022-12-24 VITALS — BP 112/73 | HR 66 | Temp 98.4°F | Resp 16

## 2022-12-24 DIAGNOSIS — Z113 Encounter for screening for infections with a predominantly sexual mode of transmission: Secondary | ICD-10-CM | POA: Insufficient documentation

## 2022-12-24 DIAGNOSIS — M25561 Pain in right knee: Secondary | ICD-10-CM | POA: Insufficient documentation

## 2022-12-24 LAB — HIV ANTIBODY (ROUTINE TESTING W REFLEX): HIV Screen 4th Generation wRfx: NONREACTIVE

## 2022-12-24 NOTE — ED Triage Notes (Signed)
Requesting STD testing. Also states his right knee is hurting and has been hurting x 2 weeks. Denies any known injury to area. Does not recall what he was doing when it started hurting.

## 2022-12-24 NOTE — ED Provider Notes (Signed)
[place]    CSN: [id number] Arrival date & time: 12/24/22  1606      History   Chief Complaint Chief Complaint  Patient presents with   Knee Pain    Also a STD screening - Entered by patient   Appointment    HPI Carl [name] is a 32 y.o. male.   Patient presents for STD testing.  He reports he is asymptomatic at this time. He also complains of right knee pain that started about 2 weeks ago.  Denies injury or trauma.  He reports no history of knee pains.  He reports knee pain is worse when bending and loading.  He denies swelling, bruising.  He has tried nothing for the symptoms.    Past Medical History:  Diagnosis Date   Asthma    Heart murmur     Patient Active Problem List   Diagnosis Date Noted   Low back pain 02/17/2015    History reviewed. No pertinent surgical history.     Home Medications    Prior to Admission medications   Medication Sig Start Date End Date Taking? Authorizing Provider  amoxicillin (AMOXIL) 500 MG capsule Take 1 capsule (500 mg total) by mouth 2 (two) times daily. 10/22/22   Flossie Dibble, NP  albuterol (PROVENTIL HFA;VENTOLIN HFA) 108 (90 BASE) MCG/ACT inhaler Inhale 1-2 puffs into the lungs every 6 (six) hours as needed for wheezing or shortness of breath. Patient not taking: Reported on 02/01/2019 12/24/14 11/25/19  Quintella Reichert, MD  cetirizine (ZYRTEC) 10 MG tablet Take 1 tablet (10 mg total) by mouth daily. 10/02/19 04/13/20  Zigmund Gottron, NP  omeprazole (PRILOSEC) 20 MG capsule Take 1 capsule (20 mg total) by mouth daily. Patient not taking: Reported on 02/01/2019 12/24/14 11/25/19  Quintella Reichert, MD    Family History Family History  Problem Relation Age of Onset   Healthy Mother    Healthy Father     Social History Social History   Tobacco Use   Smoking status: Never   Smokeless tobacco: Never  Vaping Use   Vaping Use: Some days  Substance Use Topics   Alcohol use: Yes    Alcohol/week:  0.0 standard drinks of alcohol    Comment: social   Drug use: No     Allergies   Nsaids   Review of Systems Review of Systems  Constitutional:  Negative for chills and fever.  HENT:  Negative for ear pain and sore throat.   Eyes:  Negative for pain and visual disturbance.  Respiratory:  Negative for cough and shortness of breath.   Cardiovascular:  Negative for chest pain and palpitations.  Gastrointestinal:  Negative for abdominal pain and vomiting.  Genitourinary:  Negative for dysuria and hematuria.  Musculoskeletal:  Positive for arthralgias. Negative for back pain.  Skin:  Negative for color change and rash.  Neurological:  Negative for seizures and syncope.  All other systems reviewed and are negative.    Physical Exam Triage Vital Signs ED Triage Vitals  Enc Vitals Group     BP 12/24/22 1634 112/73     Pulse Rate 12/24/22 1634 66     Resp 12/24/22 1634 16     Temp 12/24/22 1634 98.4 F (36.9 C)     Temp Source 12/24/22 1634 Oral     SpO2 12/24/22 1634 97 %     Weight --      Height --      Head Circumference --  Peak Flow --      Pain Score 12/24/22 1636 7     Pain Loc --      Pain Edu? --      Excl. in [place]? --    No data found.  Updated Vital Signs BP 112/73 (BP Location: Left Arm)   Pulse 66   Temp 98.4 F (36.9 C) (Oral)   Resp 16   SpO2 97%   Visual Acuity Right Eye Distance:   Left Eye Distance:   Bilateral Distance:    Right Eye Near:   Left Eye Near:    Bilateral Near:     Physical Exam Vitals and nursing note reviewed.  Constitutional:      General: He is not in acute distress.    Appearance: He is well-developed.  HENT:     Head: Normocephalic and atraumatic.  Eyes:     Conjunctiva/sclera: Conjunctivae normal.  Cardiovascular:     Rate and Rhythm: Normal rate and regular rhythm.     Heart sounds: No murmur heard. Pulmonary:     Effort: Pulmonary effort is normal. No respiratory distress.     Breath sounds: Normal breath  sounds.  Abdominal:     Palpations: Abdomen is soft.     Tenderness: There is no abdominal tenderness.  Musculoskeletal:        General: No swelling.     Cervical back: Neck supple.     Right knee: No swelling or bony tenderness. No LCL laxity, MCL laxity, ACL laxity or PCL laxity. Normal pulse.  Skin:    General: Skin is warm and dry.     Capillary Refill: Capillary refill takes less than 2 seconds.  Neurological:     Mental Status: He is alert.  Psychiatric:        Mood and Affect: Mood normal.      UC Treatments / Results  Labs (all labs ordered are listed, but only abnormal results are displayed) Labs Reviewed  HIV ANTIBODY (ROUTINE TESTING W REFLEX)  RPR  CYTOLOGY, (ORAL, ANAL, URETHRAL) ANCILLARY ONLY    EKG   Radiology No results found.  Procedures Procedures (including critical care time)  Medications Ordered in UC Medications - No data to display  Initial Impression / Assessment and Plan / UC Course  I have reviewed the triage vital signs and the nursing notes.  Pertinent labs & imaging results that were available during my care of the patient were reviewed by me and considered in my medical decision making (see chart for details).     Right knee pain.  No joint laxity on exam.  No joint line tenderness on exam.  Normal strength.  Advise compressive sleeve, anti-inflammatories, ice.  Advise follow-up with orthopedics if no improvement. STD testing in clinic today.  Patient is asymptomatic.  Will treat if indicated based on results. Final Clinical Impressions(s) / UC Diagnoses   Final diagnoses:  Acute pain of right knee  Screening examination for STD (sexually transmitted disease)     Discharge Instructions      Recommend ice, Tylenol as needed.  Recommend a compressive knee sleeve.  Can get this at [facility], [place].  If no improvement with knee pain recommend follow-up with orthopedics. Will call with test results and treat if  indicated. Abstain from sexual activity until test results are back and any necessary treatment has been completed.      ED Prescriptions   None    PDMP not reviewed this encounter.   Ward, Janett Billow  Z, PA-C 12/24/22 1712

## 2022-12-24 NOTE — Discharge Instructions (Signed)
Recommend ice, Tylenol as needed.  Recommend a compressive knee sleeve.  Can get this at [facility], [place].  If no improvement with knee pain recommend follow-up with orthopedics. Will call with test results and treat if indicated. Abstain from sexual activity until test results are back and any necessary treatment has been completed.

## 2022-12-25 LAB — RPR: RPR Ser Ql: NONREACTIVE

## 2022-12-26 LAB — CYTOLOGY, (ORAL, ANAL, URETHRAL) ANCILLARY ONLY
Chlamydia: NEGATIVE
Comment: NEGATIVE
Comment: NEGATIVE
Comment: NORMAL
Neisseria Gonorrhea: NEGATIVE
Trichomonas: NEGATIVE

## 2023-02-07 ENCOUNTER — Ambulatory Visit (HOSPITAL_COMMUNITY): Payer: Self-pay

## 2023-05-02 ENCOUNTER — Ambulatory Visit (HOSPITAL_COMMUNITY): Payer: Self-pay

## 2023-06-03 ENCOUNTER — Ambulatory Visit: Payer: Self-pay

## 2023-12-03 ENCOUNTER — Encounter (HOSPITAL_COMMUNITY): Payer: Self-pay

## 2023-12-03 ENCOUNTER — Emergency Department (HOSPITAL_COMMUNITY)
Admission: EM | Admit: 2023-12-03 | Discharge: 2023-12-03 | Disposition: A | Discharge status: Home / Self Care | Payer: Self-pay | Attending: Emergency Medicine | Admitting: Emergency Medicine

## 2023-12-03 ENCOUNTER — Other Ambulatory Visit: Payer: Self-pay

## 2023-12-03 DIAGNOSIS — R220 Localized swelling, mass and lump, head: Secondary | ICD-10-CM

## 2023-12-03 DIAGNOSIS — J45909 Unspecified asthma, uncomplicated: Secondary | ICD-10-CM | POA: Insufficient documentation

## 2023-12-03 DIAGNOSIS — Z7951 Long term (current) use of inhaled steroids: Secondary | ICD-10-CM | POA: Insufficient documentation

## 2023-12-03 DIAGNOSIS — K029 Dental caries, unspecified: Secondary | ICD-10-CM | POA: Insufficient documentation

## 2023-12-03 MED ORDER — HYDROCODONE-ACETAMINOPHEN 5-325 MG PO TABS: 1.0000 | ORAL_TABLET | Freq: Four times a day (QID) | ORAL | 0 refills | Status: DC | PRN

## 2023-12-03 MED ORDER — HYDROCODONE-ACETAMINOPHEN 5-325 MG PO TABS
1.0000 | ORAL_TABLET | Freq: Once | ORAL | Status: AC
  Administered 2023-12-03: 1 via ORAL
  Filled 2023-12-03: qty 1

## 2023-12-03 MED ORDER — AMOXICILLIN-POT CLAVULANATE 875-125 MG PO TABS: 1.0000 | ORAL_TABLET | Freq: Two times a day (BID) | ORAL | 0 refills | Status: DC

## 2023-12-03 MED ORDER — AMOXICILLIN-POT CLAVULANATE 875-125 MG PO TABS
1.0000 | ORAL_TABLET | Freq: Once | ORAL | Status: AC
  Administered 2023-12-03: 1 via ORAL
  Filled 2023-12-03: qty 1

## 2023-12-03 NOTE — ED Notes (Signed)
PT's left cheek is visibly swollen and PT reports a pain of 10

## 2023-12-03 NOTE — Discharge Instructions (Signed)
If the above dentist does not work out a Engineer, petroleum resources in our area were given to you.  Make sure you are rinsing your mouth after eating with mouthwash or salt water gargling and spitting to clean the mouth after eating.  You got your first dose of antibiotic here you can take your next dose this evening.  You can also do an ice pack and heating pack on your cheek.  Usually takes about 24 hours for the antibiotics to kick in

## 2023-12-03 NOTE — ED Provider Notes (Signed)
[place] [facility] AT [facility] Provider Note   CSN: [id number] Arrival date & time: 12/03/23  4098     History  Chief Complaint  Patient presents with   Knee Pain   Facial Swelling   Dental Problem    Carl [name] is a 33 y.o. male.  Patient is a 33 year old male with a history of asthma and a heart murmur who presents today with complaints of left-sided facial pain that started yesterday.  He states that he had the pain yesterday but there was no significant swelling until this morning.  Now the whole side of his face hurts and he has a lot of swelling.  It is painful to open and close his mouth but denies any difficulty swallowing or breathing.  He denies any fevers.  He initially reported some knee pain but when asked about this he states that tonight why he is here and he just wants to deal with his face.  He does think he may have a cavity on that side.  He denies any trauma to the area.  The history is provided by the patient.  Knee Pain      Home Medications Prior to Admission medications   Medication Sig Start Date End Date Taking? Authorizing Provider  amoxicillin-clavulanate (AUGMENTIN) 875-125 MG tablet Take 1 tablet by mouth every 12 (twelve) hours. 12/03/23  Yes Gwyneth Sprout, MD  HYDROcodone-acetaminophen (NORCO/VICODIN) 5-325 MG tablet Take 1 tablet by mouth every 6 (six) hours as needed for severe pain (pain score 7-10). 12/03/23  Yes Mackynzie Woolford, Alphonzo Lemmings, MD  amoxicillin (AMOXIL) 500 MG capsule Take 1 capsule (500 mg total) by mouth 2 (two) times daily. 10/22/22   Debby Freiberg, NP  albuterol (PROVENTIL HFA;VENTOLIN HFA) 108 (90 BASE) MCG/ACT inhaler Inhale 1-2 puffs into the lungs every 6 (six) hours as needed for wheezing or shortness of breath. Patient not taking: Reported on 02/01/2019 12/24/14 11/25/19  Tilden Fossa, MD  cetirizine (ZYRTEC) 10 MG tablet Take 1 tablet (10 mg total) by mouth daily. 10/02/19 04/13/20  Georgetta Haber, NP  omeprazole (PRILOSEC) 20 MG capsule Take 1 capsule (20 mg total) by mouth daily. Patient not taking: Reported on 02/01/2019 12/24/14 11/25/19  Tilden Fossa, MD      Allergies    Nsaids    Review of Systems   Review of Systems  Physical Exam Updated Vital Signs BP 120/88 (BP Location: Right Arm)   Pulse 69   Temp 98.3 F (36.8 C)   Resp 14   Ht 6\' 1"  (1.854 m)   Wt 88.5 kg   SpO2 100%   BMI 25.73 kg/m  Physical Exam Vitals and nursing note reviewed.  Constitutional:      General: He is not in acute distress.    Appearance: Normal appearance.  HENT:     Head: Normocephalic.      Mouth/Throat:     Dentition: Dental caries present.      Comments: Lower teeth appear appropriate and are nontender Neck:     Comments: No mandible pain or pain in the anterior soft tissue of the neck.  No trismus Cardiovascular:     Rate and Rhythm: Normal rate.  Pulmonary:     Effort: Pulmonary effort is normal.  Musculoskeletal:     Cervical back: Neck supple.  Lymphadenopathy:     Cervical: Cervical adenopathy present.  Neurological:     Mental Status: He is alert. Mental status is at baseline.  Psychiatric:  Mood and Affect: Mood normal.        Behavior: Behavior normal.     ED Results / Procedures / Treatments   Labs (all labs ordered are listed, but only abnormal results are displayed) Labs Reviewed - No data to display  EKG None  Radiology No results found.  Procedures Procedures    Medications Ordered in ED Medications  amoxicillin-clavulanate (AUGMENTIN) 875-125 MG per tablet 1 tablet (has no administration in time range)  HYDROcodone-acetaminophen (NORCO/VICODIN) 5-325 MG per tablet 1 tablet (has no administration in time range)    ED Course/ Medical Decision Making/ A&P                                 Medical Decision Making Risk Prescription drug management.   Pt with dental caries and facial swelling.  No signs of ludwig's  angina or difficulty swallowing and no systemic symptoms. Will treat with augmentin and have pt f/u with dentist.         Final Clinical Impression(s) / ED Diagnoses Final diagnoses:  Facial swelling  Dental caries    Rx / DC Orders ED Discharge Orders          Ordered    amoxicillin-clavulanate (AUGMENTIN) 875-125 MG tablet  Every 12 hours        12/03/23 1221    HYDROcodone-acetaminophen (NORCO/VICODIN) 5-325 MG tablet  Every 6 hours PRN        12/03/23 1221              Gwyneth Sprout, MD 12/03/23 1225

## 2023-12-03 NOTE — ED Triage Notes (Signed)
Pt. Stated, I woke up this morning and my left side  of face swelling, I have a bad upper tooth . I also left knee pain , no idea what I did to hurt it.

## 2023-12-05 ENCOUNTER — Other Ambulatory Visit: Payer: Self-pay

## 2023-12-05 ENCOUNTER — Encounter (HOSPITAL_COMMUNITY): Payer: Self-pay

## 2023-12-05 ENCOUNTER — Emergency Department (HOSPITAL_COMMUNITY)
Admission: EM | Admit: 2023-12-05 | Discharge: 2023-12-05 | Disposition: A | Discharge status: Home / Self Care | Payer: No Typology Code available for payment source | Attending: Emergency Medicine | Admitting: Emergency Medicine

## 2023-12-05 ENCOUNTER — Emergency Department (HOSPITAL_COMMUNITY): Payer: No Typology Code available for payment source

## 2023-12-05 DIAGNOSIS — K0889 Other specified disorders of teeth and supporting structures: Secondary | ICD-10-CM | POA: Diagnosis present

## 2023-12-05 DIAGNOSIS — R600 Localized edema: Secondary | ICD-10-CM

## 2023-12-05 DIAGNOSIS — K029 Dental caries, unspecified: Secondary | ICD-10-CM | POA: Diagnosis not present

## 2023-12-05 MED ORDER — IBUPROFEN 200 MG PO TABS
600.0000 mg | ORAL_TABLET | Freq: Once | ORAL | Status: AC
  Administered 2023-12-05: 600 mg via ORAL
  Filled 2023-12-05: qty 3

## 2023-12-05 NOTE — ED Triage Notes (Signed)
C/o left sided facial swelling and dental pain x2 days. Pt reports prescribed abx on 1/21 and has took 4 doses of abx w/o relief.   Pt reports calling dentist but dentist wont see him until swelling has improved.

## 2023-12-05 NOTE — ED Provider Notes (Signed)
[place] [facility]MENT AT [facility] Provider Note   CSN: [id number] Arrival date & time: 12/05/23  1222     History  Chief Complaint  Patient presents with   Dental Pain    Carl [name] is a 33 y.o. male presents with concern for left-sided facial swelling and pain that began about 3 days ago.  States he was seen in the ER at that time and was prescribed Augmentin which she has been taking without relief of his symptoms. Today his left eye was also more swollen and it was difficult to open the eye in the morning.  Denies any pain with eye movement.  Denies any difficulties swallowing or breathing.  Has been taking Motrin and the Norco prescribed for his pain.   Dental Pain      Home Medications Prior to Admission medications   Medication Sig Start Date End Date Taking? Authorizing Provider  amoxicillin (AMOXIL) 500 MG capsule Take 1 capsule (500 mg total) by mouth 2 (two) times daily. 10/22/22   Debby Freiberg, NP  amoxicillin-clavulanate (AUGMENTIN) 875-125 MG tablet Take 1 tablet by mouth every 12 (twelve) hours. 12/03/23   Gwyneth Sprout, MD  HYDROcodone-acetaminophen (NORCO/VICODIN) 5-325 MG tablet Take 1 tablet by mouth every 6 (six) hours as needed for severe pain (pain score 7-10). 12/03/23   Gwyneth Sprout, MD  albuterol (PROVENTIL HFA;VENTOLIN HFA) 108 (90 BASE) MCG/ACT inhaler Inhale 1-2 puffs into the lungs every 6 (six) hours as needed for wheezing or shortness of breath. Patient not taking: Reported on 02/01/2019 12/24/14 11/25/19  Tilden Fossa, MD  cetirizine (ZYRTEC) 10 MG tablet Take 1 tablet (10 mg total) by mouth daily. 10/02/19 04/13/20  Georgetta Haber, NP  omeprazole (PRILOSEC) 20 MG capsule Take 1 capsule (20 mg total) by mouth daily. Patient not taking: Reported on 02/01/2019 12/24/14 11/25/19  Tilden Fossa, MD      Allergies    Nsaids    Review of Systems   Review of Systems  Physical Exam Updated Vital Signs BP  (!) 146/78 (BP Location: Left Arm)   Pulse 80   Temp 99.4 F (37.4 C) (Oral)   Resp 18   Ht 6\' 1"  (1.854 m)   Wt 88 kg   SpO2 97%   BMI 25.60 kg/m  Physical Exam Vitals and nursing note reviewed.  Constitutional:      Appearance: Normal appearance.  HENT:     Head: Atraumatic.     Comments: Edema overlying the left side of face which extends from the left lower mandible up into the left lower eyelid.  Tender to palpation of the left lower and upper mandible  Not able to open mouth fully   Dentition intact with dental caries in the left upper molar. No obvious dental abscess or peritonsillar abscess Eyes:     Extraocular Movements: Extraocular movements intact.     Pupils: Pupils are equal, round, and reactive to light.  Neck:     Comments: Neck soft and supple Cervical lymphadenopathy present bilaterally Cardiovascular:     Rate and Rhythm: Normal rate and regular rhythm.  Pulmonary:     Effort: Pulmonary effort is normal.  Neurological:     General: No focal deficit present.     Mental Status: He is alert.  Psychiatric:        Mood and Affect: Mood normal.        Behavior: Behavior normal.     ED Results / Procedures / Treatments  Labs (all labs ordered are listed, but only abnormal results are displayed) Labs Reviewed - No data to display  EKG None  Radiology CT Maxillofacial Wo Contrast Result Date: 12/05/2023 CLINICAL DATA:  Left-sided facial and orbital swelling, dental pain for 2 days EXAM: CT MAXILLOFACIAL WITHOUT CONTRAST TECHNIQUE: Multidetector CT imaging of the maxillofacial structures was performed. Multiplanar CT image reconstructions were also generated. RADIATION DOSE REDUCTION: This exam was performed according to the departmental dose-optimization program which includes automated exposure control, adjustment of the mA and/or kV according to patient size and/or use of iterative reconstruction technique. COMPARISON:  None Available. FINDINGS:  Osseous: There are no acute displaced fractures. There are no destructive bony abnormalities. There is a large dental carie within the left upper posterior molar, tooth 14. Subtle periapical lucencies are seen. Dental follow-up recommended. Orbits: Negative. No traumatic or inflammatory finding. Sinuses: There is polypoid mucosal thickening of the left maxillary sinus. No gas fluid levels. The remaining paranasal sinuses are clear. Soft tissues: There is subcutaneous edema throughout the left side of the face, extending from the infraorbital tissues to the submandibular region. No evidence of fluid collection or abscess. Reactive adenopathy is seen, greatest at the angle the mandible measuring up to 14 mm in short axis. Limited intracranial: No significant or unexpected finding. IMPRESSION: 1. Large dental carie within the left upper posterior molar, with subtle periapical lucencies. Dental follow-up recommended. 2. Subcutaneous edema within the left facial soft tissues, extending from the infraorbital region to the submandibular region. No fluid collection or abscess. 3. Left-sided submandibular reactive adenopathy. Electronically Signed   By: Sharlet Salina M.D.   On: 12/05/2023 19:13    Procedures Procedures    Medications Ordered in ED Medications  ibuprofen (ADVIL) tablet 600 mg (600 mg Oral Given 12/05/23 1752)    ED Course/ Medical Decision Making/ A&P                                 Medical Decision Making Amount and/or Complexity of Data Reviewed Radiology: ordered.  Risk OTC drugs.     Differential diagnosis includes but is not limited to dental caries, dental abscess, tonsillar abscess, cellulitis, Ludwig angina, parotitis, pre-septal cellulitis, orbital cellulitis  ED Course:  Patient well-appearing, stable vital signs.  Does have visual left-sided facial swelling overlying the mandible.  Dentition intact, but does have a large dental carry in the upper left molar.  He is  tender over this area.  Able to open mouth, no trismus.  Neck soft and supple, no concern for Ludwig angina at this time.  No obvious dental abscess.  However, given concern for worsening of symptoms, CT maxillofacial was obtained which showed a large dental carry in the upper left molar and subcutaneous edema of the left facial soft tissues.  No abscess visualized.  Although he does have some edema extending up into the left lower eyelid, patient without any pain with EOM, no signs of deep infection/orbital cellulitis on CT.  Patient given ibuprofen for pain  Patient stable and appropriate for discharge home at this time  Impression: Left upper molar dental carry and associated soft tissue swelling  Disposition:  The patient was discharged home with instructions to finish course of Augmentin as prescribed by previous provider.  Tylenol and ibuprofen as needed for pain.  Follow-up with dentist as soon as possible. Return precautions given.  Imaging Studies ordered: I ordered imaging studies including CT maxillofacial  I independently visualized the imaging with scope of interpretation limited to determining acute life threatening conditions related to emergency care. Imaging showed no abscess or fluid collection, large dental carry with a left molar I agree with the radiologist interpretation  External records from outside source obtained and reviewed including ER note from 12/02/2022 where patient was seen for left-sided facial pain and swelling and prescribed Augmentin and Norco              Final Clinical Impression(s) / ED Diagnoses Final diagnoses:  Pain due to dental caries  Facial edema    Rx / DC Orders ED Discharge Orders     None         Arabella Merles, Cordelia Poche 12/05/23 Rayne Du, MD 12/09/23 1213

## 2023-12-05 NOTE — ED Notes (Signed)
Patient refused final set of vitals.

## 2023-12-05 NOTE — Discharge Instructions (Signed)
Your CT today showed that you have a large cavity in your upper left molar.  This is likely the source of your pain and swelling.  You may continue to take the Augmentin prescribed.  Please follow-up with your dentist as soon as possible for further management.  Take 600mg  ibuprofen, then 3 hours later take 1000mg  tylenol, then 3 hours later 600mg  ibuprofen, then 3 hours later 1000mg  tylenol, so on and so forth for pain control.  You are given your first dose of ibuprofen at 5:30 today.  Return the ER if you have any difficulty breathing, difficulty swallowing, unexplained fevers, any other new or concerning symptoms

## 2024-05-20 ENCOUNTER — Ambulatory Visit: Payer: Self-pay

## 2024-05-22 ENCOUNTER — Ambulatory Visit (HOSPITAL_COMMUNITY): Payer: Self-pay

## 2024-05-23 ENCOUNTER — Ambulatory Visit (HOSPITAL_COMMUNITY): Payer: Self-pay

## 2024-09-30 ENCOUNTER — Ambulatory Visit (HOSPITAL_COMMUNITY): Admission: EM | Admit: 2024-09-30 | Discharge: 2024-09-30 | Disposition: A | Discharge status: Home / Self Care

## 2024-09-30 ENCOUNTER — Ambulatory Visit (HOSPITAL_COMMUNITY): Payer: Self-pay

## 2024-09-30 ENCOUNTER — Encounter (HOSPITAL_COMMUNITY): Payer: Self-pay

## 2024-09-30 DIAGNOSIS — K047 Periapical abscess without sinus: Secondary | ICD-10-CM

## 2024-09-30 MED ORDER — TRAMADOL HCL 50 MG PO TABS: 100.0000 mg | ORAL_TABLET | Freq: Two times a day (BID) | ORAL | 0 refills | Status: AC | PRN

## 2024-09-30 MED ORDER — AMOXICILLIN-POT CLAVULANATE 875-125 MG PO TABS: 1.0000 | ORAL_TABLET | Freq: Two times a day (BID) | ORAL | 0 refills | Status: AC

## 2024-09-30 NOTE — ED Triage Notes (Signed)
 Pt c/o rt lower toothache x2wks. States took OTC meds with no relief.

## 2024-09-30 NOTE — Discharge Instructions (Addendum)
  1. Dental infection (Primary) - amoxicillin -clavulanate (AUGMENTIN ) 875-125 MG tablet; Take 1 tablet by mouth every 12 (twelve) hours.  Dispense: 14 tablet; Refill: 0 - traMADol  (ULTRAM ) 50 MG tablet; Take 2 tablets (100 mg total) by mouth every 12 (twelve) hours as needed for severe pain (pain score 7-10).  Dispense: 20 tablet; Refill: 0 - Wash mouth out 2-3 times a day with mouthwash to decrease bacterial load in the mouth to prevent secondary infection from forming. - Follow-up with dentist as soon as possible for further evaluation and treatment.  Dental resource list provided at discharge.  -Continue to monitor symptoms for any change in severity if there is any escalation of current symptoms or development of new symptoms follow-up in ER for further evaluation and management.

## 2024-09-30 NOTE — ED Provider Notes (Signed)
 [facility] [place]  Note:  This document was prepared using Conservation officer, historic buildings and may include unintentional dictation errors.  MRN: [id number] DOB: [birth year]  Subjective:   Carl [name] is a 33 y.o. male presenting for right lower tooth pain x 2 weeks.  Patient has been taking over-the-counter Tylenol  with minimal improvement to symptoms.  Patient denies any known injury or trauma to the teeth.  No past history of severe dental infections or dental caries.  Patient denies having seen a dentist in several years.  Patient does not currently have a dentist set up.  Denies fever, severe pain, purulent discharge from the tooth.  No current facility-administered medications for this encounter.  Current Outpatient Medications:    amoxicillin -clavulanate (AUGMENTIN ) 875-125 MG tablet, Take 1 tablet by mouth every 12 (twelve) hours., Disp: 14 tablet, Rfl: 0   traMADol  (ULTRAM ) 50 MG tablet, Take 2 tablets (100 mg total) by mouth every 12 (twelve) hours as needed for severe pain (pain score 7-10)., Disp: 20 tablet, Rfl: 0   Allergies  Allergen Reactions   Nsaids Nausea And Vomiting    Can't take aleve  but can take ibuprofen   12/05/23: Pt states he has taken motrin  without difficulty      Past Medical History:  Diagnosis Date   Asthma    [name] murmur      History reviewed. No pertinent surgical history.  Family History  Problem Relation Age of Onset   Healthy Mother    Healthy Father     Social History   Tobacco Use   Smoking status: Never   Smokeless tobacco: Never  Vaping Use   Vaping status: Some Days  Substance Use Topics   Alcohol use: Yes    Alcohol/week: 0.0 standard drinks of alcohol    Comment: social   Drug use: No    ROS Refer to HPI for ROS details.  Objective:    Vitals: BP 121/75 (BP Location: Left Arm)   Pulse 63   Temp 98.2 F (36.8 C) (Oral)   Resp 18   SpO2 98%   Physical Exam Vitals and nursing note reviewed.   Constitutional:      General: He is not in acute distress.    Appearance: Normal appearance. He is well-developed. He is not ill-appearing or toxic-appearing.  HENT:     Head: Normocephalic.     Mouth/Throat:     Lips: Pink.     Dentition: Dental tenderness and dental caries present. No gingival swelling or dental abscesses.   Cardiovascular:     Rate and Rhythm: Normal rate.  Pulmonary:     Effort: Pulmonary effort is normal. No respiratory distress.  Skin:    General: Skin is warm and dry.  Neurological:     General: No focal deficit present.     Mental Status: He is alert and oriented to person, place, and time.  Psychiatric:        Mood and Affect: Mood normal.        Behavior: Behavior normal.     Procedures  No results found for this or any previous visit (from the past 24 hours).  Assessment and Plan :     Discharge Instructions       1. Dental infection (Primary) - amoxicillin -clavulanate (AUGMENTIN ) 875-125 MG tablet; Take 1 tablet by mouth every 12 (twelve) hours.  Dispense: 14 tablet; Refill: 0 - traMADol  (ULTRAM ) 50 MG tablet; Take 2 tablets (100 mg total) by mouth every 12 (twelve) hours as  needed for severe pain (pain score 7-10).  Dispense: 20 tablet; Refill: 0 - Wash mouth out 2-3 times a day with mouthwash to decrease bacterial load in the mouth to prevent secondary infection from forming. - Follow-up with dentist as soon as possible for further evaluation and treatment.  Dental resource list provided at discharge.  -Continue to monitor symptoms for any change in severity if there is any escalation of current symptoms or development of new symptoms follow-up in ER for further evaluation and management.      Franchesca Veneziano B [place]   Brooklen Runquist, [place], [place] 09/30/24 1850
